# Patient Record
Sex: Male | Born: 1955 | Race: Black or African American | Hispanic: No | Marital: Single | Smoking: Never smoker
Health system: Southern US, Community
[De-identification: ages and names within clinical notes are randomized; demographics above are authoritative.]

## PROBLEM LIST (undated history)

## (undated) DIAGNOSIS — N200 Calculus of kidney: Secondary | ICD-10-CM

## (undated) DIAGNOSIS — R351 Nocturia: Secondary | ICD-10-CM

## (undated) HISTORY — PX: APPENDECTOMY: SHX54

---

## 2001-09-10 ENCOUNTER — Emergency Department (HOSPITAL_COMMUNITY): Admission: EM | Admit: 2001-09-10 | Discharge: 2001-09-10 | Payer: Self-pay

## 2001-09-10 ENCOUNTER — Encounter: Payer: Self-pay | Admitting: Emergency Medicine

## 2009-05-19 ENCOUNTER — Emergency Department (HOSPITAL_COMMUNITY): Admission: EM | Admit: 2009-05-19 | Discharge: 2009-05-19 | Payer: Self-pay | Admitting: Emergency Medicine

## 2009-05-23 ENCOUNTER — Encounter (INDEPENDENT_AMBULATORY_CARE_PROVIDER_SITE_OTHER): Payer: Self-pay | Admitting: Emergency Medicine

## 2009-05-23 ENCOUNTER — Ambulatory Visit: Payer: Self-pay | Admitting: *Deleted

## 2009-05-23 ENCOUNTER — Emergency Department (HOSPITAL_COMMUNITY): Admission: EM | Admit: 2009-05-23 | Discharge: 2009-05-23 | Payer: Self-pay | Admitting: Emergency Medicine

## 2009-05-30 ENCOUNTER — Emergency Department (HOSPITAL_COMMUNITY): Admission: EM | Admit: 2009-05-30 | Discharge: 2009-05-30 | Payer: Self-pay | Admitting: Emergency Medicine

## 2011-01-20 LAB — URINALYSIS, ROUTINE W REFLEX MICROSCOPIC
Bilirubin Urine: NEGATIVE
Glucose, UA: NEGATIVE mg/dL
Ketones, ur: NEGATIVE mg/dL
Leukocytes, UA: NEGATIVE
Nitrite: NEGATIVE
Protein, ur: 30 mg/dL — AB
Specific Gravity, Urine: 1.017 (ref 1.005–1.030)
Urobilinogen, UA: 0.2 mg/dL (ref 0.0–1.0)
pH: 6 (ref 5.0–8.0)

## 2011-01-20 LAB — URINE MICROSCOPIC-ADD ON

## 2011-01-21 LAB — CBC
HCT: 37.4 % — ABNORMAL LOW (ref 39.0–52.0)
Hemoglobin: 12.6 g/dL — ABNORMAL LOW (ref 13.0–17.0)
MCHC: 33.6 g/dL (ref 30.0–36.0)
MCV: 89.7 fL (ref 78.0–100.0)
Platelets: 205 10*3/uL (ref 150–400)
RBC: 4.18 MIL/uL — ABNORMAL LOW (ref 4.22–5.81)
RDW: 14.1 % (ref 11.5–15.5)
WBC: 12.6 10*3/uL — ABNORMAL HIGH (ref 4.0–10.5)

## 2011-01-21 LAB — DIFFERENTIAL
Basophils Absolute: 0 10*3/uL (ref 0.0–0.1)
Basophils Relative: 0 % (ref 0–1)
Lymphocytes Relative: 7 % — ABNORMAL LOW (ref 12–46)
Monocytes Absolute: 0.5 10*3/uL (ref 0.1–1.0)
Monocytes Relative: 4 % (ref 3–12)
Neutro Abs: 11.2 10*3/uL — ABNORMAL HIGH (ref 1.7–7.7)
Neutrophils Relative %: 89 % — ABNORMAL HIGH (ref 43–77)

## 2011-01-21 LAB — POCT I-STAT, CHEM 8
BUN: 24 mg/dL — ABNORMAL HIGH (ref 6–23)
Chloride: 99 mEq/L (ref 96–112)
Glucose, Bld: 108 mg/dL — ABNORMAL HIGH (ref 70–99)
HCT: 40 % (ref 39.0–52.0)
Potassium: 3.2 mEq/L — ABNORMAL LOW (ref 3.5–5.1)

## 2011-01-21 LAB — D-DIMER, QUANTITATIVE: D-Dimer, Quant: 2.82 ug/mL-FEU — ABNORMAL HIGH (ref 0.00–0.48)

## 2015-08-25 ENCOUNTER — Emergency Department (INDEPENDENT_AMBULATORY_CARE_PROVIDER_SITE_OTHER)
Admission: EM | Admit: 2015-08-25 | Discharge: 2015-08-25 | Disposition: A | Discharge status: Nursing Home (Includes ICF) | Payer: 59 | Source: Home / Self Care | Attending: Family Medicine | Admitting: Family Medicine

## 2015-08-25 ENCOUNTER — Encounter (HOSPITAL_COMMUNITY): Payer: Self-pay | Admitting: *Deleted

## 2015-08-25 ENCOUNTER — Emergency Department (HOSPITAL_COMMUNITY): Payer: 59

## 2015-08-25 ENCOUNTER — Encounter (HOSPITAL_COMMUNITY): Payer: Self-pay | Admitting: Emergency Medicine

## 2015-08-25 ENCOUNTER — Emergency Department (HOSPITAL_COMMUNITY)
Admission: EM | Admit: 2015-08-25 | Discharge: 2015-08-25 | Disposition: A | Discharge status: Home / Self Care | Payer: 59 | Attending: Emergency Medicine | Admitting: Emergency Medicine

## 2015-08-25 DIAGNOSIS — Z87442 Personal history of urinary calculi: Secondary | ICD-10-CM | POA: Insufficient documentation

## 2015-08-25 DIAGNOSIS — N200 Calculus of kidney: Secondary | ICD-10-CM

## 2015-08-25 DIAGNOSIS — R1032 Left lower quadrant pain: Secondary | ICD-10-CM

## 2015-08-25 DIAGNOSIS — N12 Tubulo-interstitial nephritis, not specified as acute or chronic: Secondary | ICD-10-CM | POA: Insufficient documentation

## 2015-08-25 LAB — URINALYSIS, ROUTINE W REFLEX MICROSCOPIC
Bilirubin Urine: NEGATIVE
GLUCOSE, UA: NEGATIVE mg/dL
KETONES UR: NEGATIVE mg/dL
Leukocytes, UA: NEGATIVE
Nitrite: NEGATIVE
PROTEIN: 30 mg/dL — AB
Specific Gravity, Urine: 1.017 (ref 1.005–1.030)
Urobilinogen, UA: 0.2 mg/dL (ref 0.0–1.0)
pH: 5.5 (ref 5.0–8.0)

## 2015-08-25 LAB — CBC
HCT: 41 % (ref 39.0–52.0)
HEMOGLOBIN: 13.6 g/dL (ref 13.0–17.0)
MCH: 29.2 pg (ref 26.0–34.0)
MCHC: 33.2 g/dL (ref 30.0–36.0)
MCV: 88 fL (ref 78.0–100.0)
PLATELETS: 334 10*3/uL (ref 150–400)
RBC: 4.66 MIL/uL (ref 4.22–5.81)
RDW: 13.8 % (ref 11.5–15.5)
WBC: 16 10*3/uL — AB (ref 4.0–10.5)

## 2015-08-25 LAB — COMPREHENSIVE METABOLIC PANEL
ALT: 21 U/L (ref 17–63)
ANION GAP: 7 (ref 5–15)
AST: 26 U/L (ref 15–41)
Albumin: 3.9 g/dL (ref 3.5–5.0)
Alkaline Phosphatase: 72 U/L (ref 38–126)
BUN: 20 mg/dL (ref 6–20)
CALCIUM: 9.6 mg/dL (ref 8.9–10.3)
CHLORIDE: 101 mmol/L (ref 101–111)
CO2: 28 mmol/L (ref 22–32)
CREATININE: 1.54 mg/dL — AB (ref 0.61–1.24)
GFR, EST AFRICAN AMERICAN: 54 mL/min — AB (ref >60)
GFR, EST NON AFRICAN AMERICAN: 47 mL/min — AB (ref >60)
Glucose, Bld: 125 mg/dL — ABNORMAL HIGH (ref 65–99)
Potassium: 4.7 mmol/L (ref 3.5–5.1)
SODIUM: 136 mmol/L (ref 135–145)
Total Bilirubin: 0.4 mg/dL (ref 0.3–1.2)
Total Protein: 8.4 g/dL — ABNORMAL HIGH (ref 6.5–8.1)

## 2015-08-25 LAB — URINE MICROSCOPIC-ADD ON

## 2015-08-25 LAB — POCT URINALYSIS DIP (DEVICE)
Bilirubin Urine: NEGATIVE
GLUCOSE, UA: NEGATIVE mg/dL
KETONES UR: NEGATIVE mg/dL
Leukocytes, UA: NEGATIVE
NITRITE: NEGATIVE
PH: 6.5 (ref 5.0–8.0)
PROTEIN: 30 mg/dL — AB
Specific Gravity, Urine: 1.02 (ref 1.005–1.030)
UROBILINOGEN UA: 0.2 mg/dL (ref 0.0–1.0)

## 2015-08-25 LAB — LIPASE, BLOOD: LIPASE: 23 U/L (ref 11–51)

## 2015-08-25 MED ORDER — SODIUM CHLORIDE 0.9 % IV BOLUS (SEPSIS)
1000.0000 mL | Freq: Once | INTRAVENOUS | Status: AC
  Administered 2015-08-25: 1000 mL via INTRAVENOUS

## 2015-08-25 MED ORDER — HYDROCODONE-ACETAMINOPHEN 5-325 MG PO TABS: 1.0000 | ORAL_TABLET | Freq: Four times a day (QID) | ORAL | Status: DC | PRN

## 2015-08-25 MED ORDER — HYDROCODONE-ACETAMINOPHEN 5-325 MG PO TABS
2.0000 | ORAL_TABLET | Freq: Once | ORAL | Status: AC
  Administered 2015-08-25: 2 via ORAL
  Filled 2015-08-25: qty 2

## 2015-08-25 NOTE — ED Notes (Signed)
Pt being transferred to ED via shuttle for further evaluation for LLQ pain and nausea.  Report was called to First RN in the ED.

## 2015-08-25 NOTE — ED Provider Notes (Addendum)
CSN: [id number]     Arrival date & time 08/25/15  1304 History   First MD Initiated Contact with Patient 08/25/15 1359     Chief Complaint  Patient presents with  . Abdominal Pain  . Nausea   (Consider location/radiation/quality/duration/timing/severity/associated sxs/prior Treatment) Patient is a 59 y.o. male presenting with abdominal pain. The history is provided by the patient. No language interpreter was used.  Abdominal Pain Associated symptoms: chills, fatigue and nausea   Associated symptoms: no chest pain, no cough, no diarrhea, no dysuria, no fever and no vomiting   Patient presents with complaint of LLQ abdominal pain, decreased appetite and feeling hot/chills during the day since he woke up this morning. Abd pain is described as "achy", has been present since waking up and has not remitted. No medications other than pepto bismol for some nausea; no emesis. No diarrhea. Last BM last night, formed and no blood. No dysuria, no change in urinary habits or apppearance of urine.   Surgical Hx; Appy 20+ years ago.   History reviewed. No pertinent past medical history. Past Surgical History  Procedure Laterality Date  . Appendectomy     History reviewed. No pertinent family history. Social History  Substance Use Topics  . Smoking status: Never Smoker   . Smokeless tobacco: None  . Alcohol Use: Yes     Comment: occasional    Review of Systems  Constitutional: Positive for chills, diaphoresis, activity change, appetite change and fatigue. Negative for fever.  Respiratory: Negative for cough, chest tightness and wheezing.   Cardiovascular: Negative for chest pain and palpitations.  Gastrointestinal: Positive for nausea and abdominal pain. Negative for vomiting, diarrhea, blood in stool and anal bleeding.  Genitourinary: Negative for dysuria.    Allergies  Review of patient's allergies indicates no known allergies.  Home Medications   Prior to Admission medications   Not  on File   Meds Ordered and Administered this Visit  Medications - No data to display  BP 158/68 mmHg  Pulse 54  Temp(Src) 98.3 F (36.8 C) (Oral)  Resp 16  SpO2 100% No data found.   Physical Exam  Constitutional: He appears well-developed and well-nourished. No distress.  Generally well appearing, mildly ill appearing but in no apparent distress  HENT:  Head: Normocephalic and atraumatic.  Mouth/Throat: Oropharynx is clear and moist. No oropharyngeal exudate.  Neck: Normal range of motion. Neck supple.  Cardiovascular: Normal rate, regular rhythm and normal heart sounds.   Pulmonary/Chest: Effort normal and breath sounds normal. No respiratory distress. He has no wheezes. He has no rales. He exhibits no tenderness.  Abdominal: Soft. Bowel sounds are normal. He exhibits no distension and no mass. There is no tenderness. There is no rebound and no guarding.  Negative for CVA tenderness.  Skin without rashes or vesicular lesions; he describes the area of pain as radiating from flank (mid-axillary line) at around T10 level, radiating anteriorly and inferiorly to LLQ.   Lymphadenopathy:    He has no cervical adenopathy.  Skin: He is not diaphoretic.    ED Course  Procedures (including critical care time)  Labs Review Labs Reviewed - No data to display  Imaging Review No results found.   Visual Acuity Review  Right Eye Distance:   Left Eye Distance:   Bilateral Distance:    Right Eye Near:   Left Eye Near:    Bilateral Near:         MDM   1. Abdominal pain, acute, left lower  quadrant    Patient with LLQ abdominal pain, without associated symptoms attributable to GI, GU or MSK systems. No tenderness on exam; no findings on skin to support possible diagnosis of zoster. UA positive for Hgb, concern for nephrolithiasis but cannot exclude other GU or GI causes. Transfer to ED for possible CT abd/pelvis, CBC for WBC count.      Barbaraann Barthel, MD 08/25/15  1422  Barbaraann Barthel, MD 08/25/15 [phone]

## 2015-08-25 NOTE — ED Notes (Signed)
Pt is aware urine is needed, pt is unable to urinate at this time. Urinal at bedside.  

## 2015-08-25 NOTE — ED Notes (Signed)
Pt states he woke up with a slight pain in his LLQ this morning, but the pain has progressed all day.  He reports some mild radiation to his LL Back.  He reports no BM today, but also hasn't eaten all day.  He took some Pepto-Bismol that relieved the nausea, but the pain has not gone away.  He denies any problems with urination or fever, but does report chills and sweating.

## 2015-08-25 NOTE — ED Provider Notes (Signed)
Arrival Date & Time: 08/25/15 & 1458 History   Chief Complaint  Patient presents with  . Abdominal Pain   HPI James Horn is a 59 y.o. male who presents for assessment of left lower quadrant pain that radiates from left flank. Patient states that he is symptomatic with a dull cramp and ache that is constant since this morning. Patient had nausea and emesis 1 however has no abnormal bowel function and no fevers or chills no shortness of breath or chest pain. Patient states that he has no back pain. And no urinary symptoms. Patient has not noticed blood in urine. Patient has remote history of prior kidney stones however does not remember which side there are on and states that no intervention was required and they sent him home with a strainer and that this was approximately 25 years prior.  I reviewed & agree with nursing's documentation of PMHx, PSHx, SHx & FHx. History reviewed. No pertinent past medical history. Past Surgical History  Procedure Laterality Date  . Appendectomy     Social History   Social History  . Marital Status: Single    Spouse Name: N/A  . Number of Children: N/A  . Years of Education: N/A   Social History Main Topics  . Smoking status: Never Smoker   . Smokeless tobacco: None  . Alcohol Use: Yes     Comment: occasional  . Drug Use: No  . Sexual Activity: Not Asked   Other Topics Concern  . None   Social History Narrative   No family history on file.  Review of Systems   Complete Review of Systems obtained and is negative except as stated in HPI.  Allergies  Review of patient's allergies indicates no known allergies.  Home Medications   Prior to Admission medications   Not on File    Physical Exam  BP 171/77 mmHg  Pulse 52  Temp(Src) 97.7 F (36.5 C) (Oral)  Resp 16  SpO2 97% Physical Exam  Constitutional: He is oriented to person, place, and time. He appears well-developed and well-nourished.  Non-toxic appearance. He does not  appear ill. No distress.  HENT:  Head: Normocephalic and atraumatic.  Right Ear: External ear normal.  Left Ear: External ear normal.  Eyes: Pupils are equal, round, and reactive to light. No scleral icterus.  Neck: Normal range of motion. Neck supple. No tracheal deviation present.  Cardiovascular: Normal heart sounds and intact distal pulses.   No murmur heard. Pulmonary/Chest: Effort normal and breath sounds normal. No stridor. No respiratory distress. He has no wheezes. He has no rales.  Abdominal: Soft. Bowel sounds are normal. He exhibits no distension. There is tenderness (LLQ). There is no rebound and no guarding.  No CVA tenderness bilaterally.  Musculoskeletal: Normal range of motion.  Neurological: He is alert and oriented to person, place, and time. He has normal strength and normal reflexes. No cranial nerve deficit or sensory deficit.  Skin: Skin is warm and dry. No pallor.  No vesicles or rashes.   Psychiatric: He has a normal mood and affect. His behavior is normal.  Nursing note and vitals reviewed.   ED Course  Procedures  Labs Review Labs Reviewed  COMPREHENSIVE METABOLIC PANEL - Abnormal; Notable for the following:    Glucose, Bld 125 (*)    Creatinine, Ser 1.54 (*)    Total Protein 8.4 (*)    GFR calc non Af Amer 47 (*)    GFR calc Af Amer 54 (*)  All other components within normal limits  CBC - Abnormal; Notable for the following:    WBC 16.0 (*)    All other components within normal limits  LIPASE, BLOOD  URINALYSIS, ROUTINE W REFLEX MICROSCOPIC (NOT AT [facility])    Imaging Review No results found.  Laboratory and Imaging results were personally reviewed by myself and used in the medical decision making of this patient's treatment and disposition.  EKG Interpretation  EKG Interpretation  Date/Time:    Ventricular Rate:    PR Interval:    QRS Duration:   QT Interval:    QTC Calculation:   R Axis:     Text Interpretation:        MDM  James Horn is a 59 y.o. male with H&P as above. ED clinical course as follows:  Prior workup in urgent care shows UA with gross blood however no concerns of infection at that time. Urine studies repeated along with microscopic and repeat shows no evidence of infection however moderate amounts of blood.  Review of evaluate for nephrolithiasis ultrasound was obtained which reveals nonobstructing stones in left kidney without associated hydronephrosis bilaterally. No abnormal findings of right kidney. Patient is well-appearing without altered mental status or vital sign abnormalities afebrile and without hypotension therefore do not suspect sepsis however in light of elevated white blood cell count we'll obtain CT noncontrast to evaluate for evidence of pyelonephritis. Patient has no CVA tenderness is without emesis and does not appear toxic. CT noncontrast upon my independent visualization reveals a 4 x 7 mm obstructing stone with only mild hydronephrosis of left kidney and mild perinephric stranding consistent with inflammation and no evidence of abscess. In light of patient's negative CVA tenderness and clinical picture without concerns for pyelonephritis or sepsis patient is appropriate for discharge with follow-up with urology at 8 AM tomorrow morning. I obtained consultation with the urology service and after we discussed patient's ED clinical course and imaging and laboratory work decided patient is appropriate for discharge and will be seen at 8 AM in the morning by the clinic. Return precautions discussed in great detail patient states understanding no evidence of toxicity on repeat examination and patient states pain controlled without any concerns or questions prior to discharge patient tolerating by mouth and no pain in abdomen flank or back at this time.  Patient will be discharged with by mouth pain control and given negative UA for signs of infection and absence of CVA tenderness do not suspect UTI or  pyelonephritis and therefore was not discharged with her buttocks at this time.  Disposition: Discharge.  Clinical Impression: No diagnosis found. Patient care discussed with Dr. Jeraldine Loots, who oversaw their evaluation & treatment & voiced agreement. House Officer: Jonette Eva, MD, Emergency Medicine.  Jonette Eva, MD 08/26/15 Moses Manners  Gerhard Munch, MD 08/27/15 [phone]

## 2015-08-25 NOTE — Discharge Instructions (Signed)
Kidney Stones °Kidney stones (urolithiasis) are deposits that form inside your kidneys. The intense pain is caused by the stone moving through the urinary tract. When the stone moves, the ureter goes into spasm around the stone. The stone is usually passed in the urine.  °CAUSES  °· A disorder that makes certain neck glands produce too much parathyroid hormone (primary hyperparathyroidism). °· A buildup of uric acid crystals, similar to gout in your joints. °· Narrowing (stricture) of the ureter. °· A kidney obstruction present at birth (congenital obstruction). °· Previous surgery on the kidney or ureters. °· Numerous kidney infections. °SYMPTOMS  °· Feeling sick to your stomach (nauseous). °· Throwing up (vomiting). °· Blood in the urine (hematuria). °· Pain that usually spreads (radiates) to the groin. °· Frequency or urgency of urination. °DIAGNOSIS  °· Taking a history and physical exam. °· Blood or urine tests. °· CT scan. °· Occasionally, an examination of the inside of the urinary bladder (cystoscopy) is performed. °TREATMENT  °· Observation. °· Increasing your fluid intake. °· Extracorporeal shock wave lithotripsy--This is a noninvasive procedure that uses shock waves to break up kidney stones. °· Surgery may be needed if you have severe pain or persistent obstruction. There are various surgical procedures. Most of the procedures are performed with the use of small instruments. Only small incisions are needed to accommodate these instruments, so recovery time is minimized. °The size, location, and chemical composition are all important variables that will determine the proper choice of action for you. Talk to your health care provider to better understand your situation so that you will minimize the risk of injury to yourself and your kidney.  °HOME CARE INSTRUCTIONS  °· Drink enough water and fluids to keep your urine clear or pale yellow. This will help you to pass the stone or stone fragments. °· Strain  all urine through the provided strainer. Keep all particulate matter and stones for your health care provider to see. The stone causing the pain may be as small as a grain of salt. It is very important to use the strainer each and every time you pass your urine. The collection of your stone will allow your health care provider to analyze it and verify that a stone has actually passed. The stone analysis will often identify what you can do to reduce the incidence of recurrences. °· Only take over-the-counter or prescription medicines for pain, discomfort, or fever as directed by your health care provider. °· Keep all follow-up visits as told by your health care provider. This is important. °· Get follow-up X-rays if required. The absence of pain does not always mean that the stone has passed. It may have only stopped moving. If the urine remains completely obstructed, it can cause loss of kidney function or even complete destruction of the kidney. It is your responsibility to make sure X-rays and follow-ups are completed. Ultrasounds of the kidney can show blockages and the status of the kidney. Ultrasounds are not associated with any radiation and can be performed easily in a matter of minutes. °· Make changes to your daily diet as told by your health care provider. You may be told to: °¨ Limit the amount of salt that you eat. °¨ Eat 5 or more servings of fruits and vegetables each day. °¨ Limit the amount of meat, poultry, fish, and eggs that you eat. °· Collect a 24-hour urine sample as told by your health care provider. You may need to collect another urine sample every 6-12   months. SEEK MEDICAL CARE IF:  You experience pain that is progressive and unresponsive to any pain medicine you have been prescribed. SEEK IMMEDIATE MEDICAL CARE IF:   Pain cannot be controlled with the prescribed medicine.  You have a fever or shaking chills.  The severity or intensity of pain increases over 18 hours and is not  relieved by pain medicine.  You develop a new onset of abdominal pain.  You feel faint or pass out.  You are unable to urinate.   This information is not intended to replace advice given to you by your health care provider. Make sure you discuss any questions you have with your health care provider.   Document Released: 10/01/2005 Document Revised: 06/22/2015 Document Reviewed: 03/04/2013 Elsevier Interactive Patient Education 2016 [employer] Inc[employer]  Renal Colic Renal colic is pain that is caused by passing a kidney stone. The pain can be sharp and severe. It may be felt in the back, abdomen, side (flank), or groin. It can cause nausea. Renal colic can come and go. HOME CARE INSTRUCTIONS Watch your condition for any changes. The following actions may help to lessen any discomfort that you are feeling:  Take medicines only as directed by your health care provider.  Ask your health care provider if it is okay to take over-the-counter pain medicine.  Drink enough fluid to keep your urine clear or pale yellow. Drink 6-8 glasses of water each day.  Limit the amount of salt that you eat to less than 2 grams per day.  Reduce the amount of protein in your diet. Eat less meat, fish, nuts, and dairy.  Avoid foods such as spinach, rhubarb, nuts, or bran. These may make kidney stones more likely to form. SEEK MEDICAL CARE IF:  You have a fever or chills.  Your urine smells bad or looks cloudy.  You have pain or burning when you pass urine. SEEK IMMEDIATE MEDICAL CARE IF:  Your flank pain or groin pain suddenly worsens.  You become confused or disoriented or you lose consciousness.   This information is not intended to replace advice given to you by your health care provider. Make sure you discuss any questions you have with your health care provider.   Document Released: 07/11/2005 Document Revised: 10/22/2014 Document Reviewed: 08/11/2014 Elsevier Interactive Patient Education 2016  [employer].  Lithotripsy, Care After Refer to this sheet in the next few weeks. These instructions provide you with information on caring for yourself after your procedure. Your health care provider may also give you more specific instructions. Your treatment has been planned according to current medical practices, but problems sometimes occur. Call your health care provider if you have any problems or questions after your procedure. WHAT TO EXPECT AFTER THE PROCEDURE   Your urine may have a red tinge for a few days after treatment. Blood loss is usually minimal.  You may have soreness in the back or flank area. This usually goes away after a few days. The procedure can cause blotches or bruises on the back where the pressure wave enters the skin. These marks usually cause only minimal discomfort and should disappear in a short time.  Stone fragments should begin to pass within 24 hours of treatment. However, a delayed passage is not unusual.  You may have pain, discomfort, and feel sick to your stomach (nauseated) when the crushed fragments of stone are passed down the tube from the kidney to the bladder. Stone fragments can pass soon after the procedure and  may last for up to 4-8 weeks.  A small number of patients may have severe pain when stone fragments are not able to pass, which leads to an obstruction.  If your stone is greater than 1 inch (2.5 cm) in diameter or if you have multiple stones that have a combined diameter greater than 1 inch (2.5 cm), you may require more than one treatment.  If you had a stent placed prior to your procedure, you may experience some discomfort, especially during urination. You may experience the pain or discomfort in your flank or back, or you may experience a sharp pain or discomfort at the base of your penis or in your lower abdomen. The discomfort usually lasts only a few minutes after urinating. HOME CARE INSTRUCTIONS   Rest at home until you feel  your energy improving.  Only take over-the-counter or prescription medicines for pain, discomfort, or fever as directed by your health care provider. Depending on the type of lithotripsy, you may need to take antibiotics and anti-inflammatory medicines for a few days.  Drink enough water and fluids to keep your urine clear or pale yellow. This helps "flush" your kidneys. It helps pass any remaining pieces of stone and prevents stones from coming back.  Most people can resume daily activities within 1-2 days after standard lithotripsy. It can take longer to recover from laser and percutaneous lithotripsy.  Strain all urine through the provided strainer. Keep all particulate matter and stones for your health care provider to see. The stone may be as small as a grain of salt. It is very important to use the strainer each and every time you pass your urine. Any stones that are found can be sent to a medical lab for examination.  Visit your health care provider for a follow-up appointment in a few weeks. Your doctor may remove your stent if you have one. Your health care provider will also check to see whether stone particles still remain. SEEK MEDICAL CARE IF:   Your pain is not relieved by medicine.  You have a lasting nauseous feeling.  You feel there is too much blood in the urine.  You develop persistent problems with frequent or painful urination that does not at least partially improve after 2 days following the procedure.  You have a congested cough.  You feel lightheaded.  You develop a rash or any other signs that might suggest an allergic problem.  You develop any reaction or side effects to your medicine(s). SEEK IMMEDIATE MEDICAL CARE IF:   You experience severe back or flank pain or both.  You see nothing but blood when you urinate.  You cannot pass any urine at all.  You have a fever or shaking chills.  You develop shortness of breath, difficulty breathing, or chest  pain.  You develop vomiting that will not stop after 6-8 hours.  You have a fainting episode.   This information is not intended to replace advice given to you by your health care provider. Make sure you discuss any questions you have with your health care provider.   Document Released: 10/21/2007 Document Revised: 06/22/2015 Document Reviewed: 04/16/2013 Elsevier Interactive Patient Education 2016 Elsevier Inc.  Percutaneous Nephrolithotomy Kidney stones can cause a great deal of pain. They can block urine from leaving the body. And they can lead to infection. Kidney stones might pass on their own, or they can be broken up by shock waves from a special machine. But sometimes surgery is needed to get  rid of kidney stones. One type of surgery is called percutaneous nephrolithotomy. "Nephro" means kidney, "litho" means stone and "tomy" means removal by surgery. "Percutaneous" means through the skin. This type of surgery needs only a small cut (incision) in the skin. This surgery may be suggested for various reasons. They include:  The kidney stones are 2 centimeters wide (about 3/4 inch) or bigger. They also might be oddly shaped.  Other treatments were tried, but all or some of the kidney stones remain.  Infection has developed.  No other treatment can be used. LET YOUR CAREGIVER KNOW ABOUT:   Any allergies.  All medications you are taking, including:  Herbs, eyedrops, over-the-counter medications and creams.  Blood thinners (anticoagulants), aspirin or other drugs that could affect blood clotting.  Use of steroids (by mouth or as creams).  Previous problems with anesthetics, including local anesthetics.  Possibility of pregnancy, if this applies.  Any history of blood clots.  Any history of bleeding or other blood problems.  Previous surgery.  Smoking history.  Other health problems. RISKS AND COMPLICATIONS   During surgery:  Sometimes all the kidney stones cannot  be removed through the tube. Then, the percutaneous nephrolithotomy procedure would be stopped. Open surgery would be used to remove the remaining stones.  Short-term risks from the surgery could include:  Excessive bleeding.  Blood in your urine.  Holes in the kidney. These usually heal on their own.  Pain.  Redness or tenderness at the incision site.  Numbness (loss of feeling) in the area treated. Tingling also is possible.  A pooling of blood in the wound (hematoma).  Infection.  Slow healing.  Longer-term possibilities include:  Kidney damage.  Damage to organs near the kidney.  Need for a repeat surgery. BEFORE THE PROCEDURE  You may need to take some tests before your surgery. These might include:  Blood tests.  Urine tests.  Tests to make sure your heart is working properly.  Let your healthcare provider know if you think you might have a urinary tract infection. You will probably need to take an antibiotic to treat this before the surgery.  Two weeks before your surgery, stop using aspirin and non-steroidal anti-inflammatory drugs (NSAIDs) for pain relief. This includes prescription drugs and over-the-counter drugs such as ibuprofen and naproxen. Also stop taking vitamin E.  If you take blood-thinners, ask your healthcare provider when you should stop taking them.  Do not eat or drink for about 8 hours before your surgery.  You might be asked to shower or wash with a special antibacterial soap before the procedure.  Arrive at least an hour before the surgery, or whenever your surgeon recommends. This will give you time to check in and fill out any needed paperwork. PROCEDURE  The preparation:  You will change into a hospital gown.  You will be given an IV. A needle will be inserted in your arm. Medication will be able to flow directly into your body through this needle.  You might be given a sedative. This medication will help you relax.  You may  be given a general anesthetic (a drug that will put you to sleep during the surgery). Or, you may get a local anesthetic (part of your body will be numb, but you will remain awake).  A catheter (tube) will be put in your bladder to drain urine during and after surgery.  The procedure:  The surgeon will make a small incision in your lower back.  A tube  will be inserted through the incision into your kidney.  Each kidney stone is removed through this tube. Larger stones may first be broken up with a laser (a high-intensity light beam) or other tools.  If a kidney stone has already left the kidney, the surgeon would use a special tool to bring it back in. Then it would be removed through the tube.  After all the stones are taken out, a catheter will be put in. Fluid can build up around the kidney as it heals. The catheter lets this fluid drain out of the body.  A dressing (medicine and bandage) will be put on the incision area.  The surgery usually takes three to four hours. AFTER THE PROCEDURE  You will stay in a recovery area until the anesthesia has worn off. Your blood pressure and pulse will be checked often. You might be given more pain medication.  You will be moved to a hospital room for the rest of your stay.  The catheter that is taking urine out of your body will be taken out within 24 hours.  The day after your surgery, you should be able to walk around. Walking helps prevent blood clots (thick clumps that can block the flow of blood).  You may be asked to do some breathing exercises.  You will be able to have only liquids for a day or two.  Before you go home:  The catheter that is draining fluid from the kidney area is usually taken out.  You will be taught how to care for the incision. Be sure to ask how often the dressing should be changed and when it can get wet.  You also will be told what you should and should not do while your kidney and incisions heal. For  instance, you may be urged to walk to prevent blood clots.   This information is not intended to replace advice given to you by your health care provider. Make sure you discuss any questions you have with your health care provider.   Document Released: 07/29/2009 Document Revised: 10/22/2014 Document Reviewed: 03/28/2015 Elsevier Interactive Patient Education [employer]

## 2015-08-25 NOTE — ED Notes (Signed)
Pt states that he began having LLQ pain this morning. Pt states that he had N/V as well. Denies any bowel or bladder changes

## 2015-08-26 ENCOUNTER — Telehealth (HOSPITAL_BASED_OUTPATIENT_CLINIC_OR_DEPARTMENT_OTHER): Payer: Self-pay | Admitting: Emergency Medicine

## 2015-08-26 LAB — URINE CULTURE
CULTURE: NO GROWTH
Special Requests: NORMAL

## 2015-09-14 ENCOUNTER — Other Ambulatory Visit: Payer: Self-pay | Admitting: Urology

## 2015-09-28 ENCOUNTER — Ambulatory Visit (HOSPITAL_COMMUNITY)
Admission: RE | Admit: 2015-09-28 | Discharge: 2015-09-28 | Disposition: A | Discharge status: Home / Self Care | Payer: 59 | Source: Ambulatory Visit | Attending: Anesthesiology | Admitting: Anesthesiology

## 2015-09-28 ENCOUNTER — Encounter (HOSPITAL_COMMUNITY)
Admission: RE | Admit: 2015-09-28 | Discharge: 2015-09-28 | Disposition: A | Discharge status: Home / Self Care | Payer: 59 | Source: Ambulatory Visit | Attending: Urology | Admitting: Urology

## 2015-09-28 ENCOUNTER — Encounter (HOSPITAL_COMMUNITY): Payer: Self-pay

## 2015-09-28 DIAGNOSIS — Z01812 Encounter for preprocedural laboratory examination: Secondary | ICD-10-CM | POA: Diagnosis not present

## 2015-09-28 DIAGNOSIS — Z01810 Encounter for preprocedural cardiovascular examination: Secondary | ICD-10-CM | POA: Diagnosis not present

## 2015-09-28 HISTORY — DX: Nocturia: R35.1

## 2015-09-28 HISTORY — DX: Calculus of kidney: N20.0

## 2015-09-28 LAB — BASIC METABOLIC PANEL
Anion gap: 8 (ref 5–15)
BUN: 18 mg/dL (ref 6–20)
CALCIUM: 9.4 mg/dL (ref 8.9–10.3)
CO2: 27 mmol/L (ref 22–32)
CREATININE: 1.08 mg/dL (ref 0.61–1.24)
Chloride: 103 mmol/L (ref 101–111)
Glucose, Bld: 104 mg/dL — ABNORMAL HIGH (ref 65–99)
Potassium: 3.8 mmol/L (ref 3.5–5.1)
Sodium: 138 mmol/L (ref 135–145)

## 2015-09-28 LAB — CBC
HCT: 36.9 % — ABNORMAL LOW (ref 39.0–52.0)
Hemoglobin: 12 g/dL — ABNORMAL LOW (ref 13.0–17.0)
MCH: 28.5 pg (ref 26.0–34.0)
MCHC: 32.5 g/dL (ref 30.0–36.0)
MCV: 87.6 fL (ref 78.0–100.0)
PLATELETS: 338 10*3/uL (ref 150–400)
RBC: 4.21 MIL/uL — ABNORMAL LOW (ref 4.22–5.81)
RDW: 13.8 % (ref 11.5–15.5)
WBC: 8.6 10*3/uL (ref 4.0–10.5)

## 2015-09-28 NOTE — Patient Instructions (Signed)
Turner DanielsHandy L Radich  09/28/2015   Your procedure is scheduled on: Monday October 03, 2015   [facility]  [facility] [facility]  [place] to 3rd floor to  Short Stay Center at 7:45 AM.  Call this number if you have problems the morning of surgery [phone]   Remember: ONLY 1 PERSON MAY GO WITH YOU TO SHORT STAY TO GET  READY MORNING OF YOUR SURGERY.  Do not eat food or drink liquids :After Midnight.     Take these medicines the morning of surgery with A SIP OF WATER: NONE                                You may not have any metal on your body including hair pins and              piercings  Do not wear jewelry lotions, powders or colognes, deodorant                           Men may shave face and neck.   Do not bring valuables to the hospital. [facility] IS NOT             RESPONSIBLE   FOR VALUABLES.  Contacts, dentures or bridgework may not be worn into surgery.      Patients discharged the day of surgery will not be allowed to drive home.  Name and phone number of your driver:Gwen Ophelia CharterYates (wife)  _____________________________________________________________________             [facility] Before surgery, you can play an important role.  Because skin is not sterile, your skin needs to be as free of germs as possible.  You can reduce the number of germs on your skin by washing with CHG (chlorahexidine gluconate) soap before surgery.  CHG is an antiseptic cleaner which kills germs and bonds with the skin to continue killing germs even after washing. Please DO NOT use if you have an allergy to CHG or antibacterial soaps.  If your skin becomes reddened/irritated stop using the CHG and inform your nurse when you arrive at Short Stay. Do not shave (including legs and underarms) for at least 48 hours prior to the first CHG shower.  You may shave your face/neck. Please follow these instructions carefully:  1.  Shower with CHG Soap  the night before surgery and the  morning of Surgery.  2.  If you choose to wash your hair, wash your hair first as usual with your  normal  shampoo.  3.  After you shampoo, rinse your hair and body thoroughly to remove the  shampoo.                           4.  Use CHG as you would any other liquid soap.  You can apply chg directly  to the skin and wash                       Gently with a scrungie or clean washcloth.  5.  Apply the CHG Soap to your body ONLY FROM THE NECK DOWN.   Do not use on face/ open  Wound or open sores. Avoid contact with eyes, ears mouth and genitals (private parts).                       Wash face,  Genitals (private parts) with your normal soap.             6.  Wash thoroughly, paying special attention to the area where your surgery  will be performed.  7.  Thoroughly rinse your body with warm water from the neck down.  8.  DO NOT shower/wash with your normal soap after using and rinsing off  the CHG Soap.                9.  Pat yourself dry with a clean towel.            10.  Wear clean pajamas.            11.  Place clean sheets on your bed the night of your first shower and do not  sleep with pets. Day of Surgery : Do not apply any lotions/deodorants the morning of surgery.  Please wear clean clothes to the hospital/surgery center.  FAILURE TO FOLLOW THESE INSTRUCTIONS MAY RESULT IN THE CANCELLATION OF YOUR SURGERY PATIENT SIGNATURE_________________________________  NURSE SIGNATURE__________________________________  ________________________________________________________________________

## 2015-09-29 NOTE — H&P (Signed)
History of Present Illness James Horn is 59 years old with the following urologic history:    1) Urolithiasis: He initially presented to me in November 2016 with two left ureteral calculi. He has a history of recurrent urolithiasis and has a family history of urolithiasis.    Interval history:    He follows up today for further evaluation of his left ureteral calculi. He has not passed any stones since his last visit. He denies any flank pain or hematuria. He has been completely asymptomatic. He has been compliant with straining of his urine.   Past Medical History Problems  1. History of gout (Z87.39) 2. History of hyperlipidemia (Z86.39)  Surgical History Problems  1. History of Appendectomy  Current Meds 1. Aspirin 81 MG TABS;  Therapy: (Recorded:11Nov2016) to Recorded 2. Lipitor 40 MG Oral Tablet;  Therapy: (Recorded:11Nov2016) to Recorded 3. Tamsulosin HCl - 0.4 MG Oral Capsule; Take 1 capsule by mouth at bedtime;  Therapy: 11Nov2016 to (Evaluate:25Nov2016)  Requested for: 11Nov2016; Last  Rx:11Nov2016 Ordered  Allergies Medication  1. No Known Drug Allergies  Family History Problems  1. Family history of malignant neoplasm of prostate (O96.29) : Father  Social History Problems  1. Alcohol use (Z78.9) 2. Married 3. Never a smoker  Vitals Vital Signs [Data Includes: Last 1 Day]  Recorded: 29Nov2016 04:18PM  Blood Pressure: 169 / 69 Heart Rate: 79  Physical Exam Constitutional: Well nourished and well developed . No acute distress.  Abdomen: No CVA tenderness.    Results/Data Urine [Data Includes: Last 1 Day]   29Nov2016  COLOR YELLOW   APPEARANCE CLEAR   SPECIFIC GRAVITY 1.025   pH 5.5   GLUCOSE NEGATIVE   BILIRUBIN NEGATIVE   KETONE NEGATIVE   BLOOD 2+   PROTEIN TRACE   NITRITE NEGATIVE   LEUKOCYTE ESTERASE TRACE   SQUAMOUS EPITHELIAL/HPF 0-5 HPF  WBC 6-10 WBC/HPF  RBC NONE SEEN RBC/HPF  BACTERIA FEW HPF  CRYSTALS NONE SEEN HPF  CASTS  NONE SEEN LPF  Yeast NONE SEEN HPF   I independently reviewed his KUB x-ray today. There are calcifications over the renal contours bilaterally in nonobstructing positions that likely represent nonobstructing stones. However, I am unable to visualize any clear calcifications over the expected course of the ureters bilaterally and particularly on the left side.   Assessment Assessed  1. Nephrolithiasis (N20.0) 2. Ureteral stone (N20.1)  Plan Health Maintenance  1. UA With REFLEX; [Do Not Release]; Status:Complete;   Done: 29Nov2016 04:03PM Ureteral stone  2. Follow-up Office  Follow-up  Status: Hold For - Date of Service  Requested for:  29Nov2016  Discussion/Summary 1. Left ureteral calculi: I'm still quite suspicious that his stones are still present although I cannot visualize them well on the x-ray. We have discussed multiple options including follow-up surveillance imaging versus proceeding with definitive treatment considering her strong suspicion that his stones remained present in the ureter. After discussion, he would like to tentatively schedule an elective procedure for cystoscopy, left retrograde pyelography, left ureteroscopy and possible laser lithotripsy and left ureteral stent placement for sometime over the next couple of weeks thereby giving him a chance at still been able to spontaneously pass his stones before that procedure. We have reviewed the potential risks and complications as well as expected recovery process associated with this procedure. He gives his informed consent. This will be scheduled.    2. Multiple bilateral renal calculi: I will recommend that he undergo a full metabolic evaluation following treatment of his  acute ureteral stone event.    Cc: Dr. Geri Seminoleibas Koirola     Signatures Electronically signed by : James Horn, M.D.; Sep 13 2015  5:30PM EST

## 2015-10-03 ENCOUNTER — Encounter (HOSPITAL_COMMUNITY): Payer: Self-pay | Admitting: *Deleted

## 2015-10-03 ENCOUNTER — Ambulatory Visit (HOSPITAL_COMMUNITY)
Admission: RE | Admit: 2015-10-03 | Discharge: 2015-10-03 | Disposition: A | Discharge status: Home / Self Care | Payer: 59 | Source: Ambulatory Visit | Attending: Urology | Admitting: Urology

## 2015-10-03 ENCOUNTER — Encounter (HOSPITAL_COMMUNITY)
Admission: RE | Disposition: A | Discharge status: Home / Self Care | Payer: Self-pay | Source: Ambulatory Visit | Attending: Urology

## 2015-10-03 ENCOUNTER — Ambulatory Visit (HOSPITAL_COMMUNITY): Payer: 59 | Admitting: Anesthesiology

## 2015-10-03 DIAGNOSIS — Z841 Family history of disorders of kidney and ureter: Secondary | ICD-10-CM | POA: Diagnosis not present

## 2015-10-03 DIAGNOSIS — N200 Calculus of kidney: Secondary | ICD-10-CM | POA: Insufficient documentation

## 2015-10-03 DIAGNOSIS — Z87442 Personal history of urinary calculi: Secondary | ICD-10-CM | POA: Insufficient documentation

## 2015-10-03 DIAGNOSIS — M109 Gout, unspecified: Secondary | ICD-10-CM | POA: Insufficient documentation

## 2015-10-03 DIAGNOSIS — E785 Hyperlipidemia, unspecified: Secondary | ICD-10-CM | POA: Diagnosis not present

## 2015-10-03 DIAGNOSIS — N201 Calculus of ureter: Secondary | ICD-10-CM | POA: Diagnosis present

## 2015-10-03 DIAGNOSIS — Z79899 Other long term (current) drug therapy: Secondary | ICD-10-CM | POA: Insufficient documentation

## 2015-10-03 DIAGNOSIS — Z7982 Long term (current) use of aspirin: Secondary | ICD-10-CM | POA: Insufficient documentation

## 2015-10-03 HISTORY — PX: CYSTOSCOPY WITH RETROGRADE PYELOGRAM, URETEROSCOPY AND STENT PLACEMENT: SHX5789

## 2015-10-03 SURGERY — CYSTOURETEROSCOPY, WITH RETROGRADE PYELOGRAM AND STENT INSERTION
Anesthesia: General | Laterality: Left

## 2015-10-03 MED ORDER — LIDOCAINE HCL (CARDIAC) 20 MG/ML IV SOLN
INTRAVENOUS | Status: AC
  Filled 2015-10-03: qty 5

## 2015-10-03 MED ORDER — CIPROFLOXACIN IN D5W 400 MG/200ML IV SOLN
400.0000 mg | INTRAVENOUS | Status: AC
  Administered 2015-10-03: 400 mg via INTRAVENOUS

## 2015-10-03 MED ORDER — HYDROCODONE-ACETAMINOPHEN 5-325 MG PO TABS: 1.0000 | ORAL_TABLET | Freq: Four times a day (QID) | ORAL | Status: AC | PRN

## 2015-10-03 MED ORDER — MIDAZOLAM HCL 5 MG/5ML IJ SOLN
INTRAMUSCULAR | Status: DC | PRN
  Administered 2015-10-03: 2 mg via INTRAVENOUS

## 2015-10-03 MED ORDER — IOHEXOL 300 MG/ML  SOLN
INTRAMUSCULAR | Status: DC | PRN
  Administered 2015-10-03: 5 mL via URETHRAL

## 2015-10-03 MED ORDER — PHENYLEPHRINE HCL 10 MG/ML IJ SOLN
INTRAMUSCULAR | Status: DC | PRN
  Administered 2015-10-03 (×7): 80 ug via INTRAVENOUS

## 2015-10-03 MED ORDER — PHENAZOPYRIDINE HCL 100 MG PO TABS: 100.0000 mg | ORAL_TABLET | Freq: Three times a day (TID) | ORAL | Status: AC | PRN

## 2015-10-03 MED ORDER — LACTATED RINGERS IV SOLN
INTRAVENOUS | Status: DC
  Administered 2015-10-03: 12:00:00 via INTRAVENOUS
  Administered 2015-10-03: 1000 mL via INTRAVENOUS
  Administered 2015-10-03: 11:00:00 via INTRAVENOUS

## 2015-10-03 MED ORDER — PHENYLEPHRINE 40 MCG/ML (10ML) SYRINGE FOR IV PUSH (FOR BLOOD PRESSURE SUPPORT)
PREFILLED_SYRINGE | INTRAVENOUS | Status: AC
  Filled 2015-10-03: qty 10

## 2015-10-03 MED ORDER — CIPROFLOXACIN HCL 250 MG PO TABS: 250.0000 mg | ORAL_TABLET | Freq: Two times a day (BID) | ORAL | Status: AC

## 2015-10-03 MED ORDER — SODIUM CHLORIDE 0.9 % IJ SOLN
INTRAMUSCULAR | Status: AC
  Filled 2015-10-03: qty 10

## 2015-10-03 MED ORDER — EPHEDRINE SULFATE 50 MG/ML IJ SOLN
INTRAMUSCULAR | Status: AC
  Filled 2015-10-03: qty 1

## 2015-10-03 MED ORDER — DEXAMETHASONE SODIUM PHOSPHATE 10 MG/ML IJ SOLN
INTRAMUSCULAR | Status: AC
  Filled 2015-10-03: qty 1

## 2015-10-03 MED ORDER — FENTANYL CITRATE (PF) 100 MCG/2ML IJ SOLN
INTRAMUSCULAR | Status: DC | PRN
  Administered 2015-10-03: 25 ug via INTRAVENOUS
  Administered 2015-10-03: 50 ug via INTRAVENOUS
  Administered 2015-10-03: 25 ug via INTRAVENOUS

## 2015-10-03 MED ORDER — SODIUM CHLORIDE 0.9 % IR SOLN
Status: DC | PRN
  Administered 2015-10-03: 5000 mL

## 2015-10-03 MED ORDER — 0.9 % SODIUM CHLORIDE (POUR BTL) OPTIME
TOPICAL | Status: DC | PRN
  Administered 2015-10-03: 1000 mL

## 2015-10-03 MED ORDER — ONDANSETRON HCL 4 MG/2ML IJ SOLN
INTRAMUSCULAR | Status: AC
  Filled 2015-10-03: qty 2

## 2015-10-03 MED ORDER — MIDAZOLAM HCL 2 MG/2ML IJ SOLN
INTRAMUSCULAR | Status: AC
  Filled 2015-10-03: qty 2

## 2015-10-03 MED ORDER — PROPOFOL 10 MG/ML IV BOLUS
INTRAVENOUS | Status: DC | PRN
  Administered 2015-10-03: 200 mg via INTRAVENOUS

## 2015-10-03 MED ORDER — FENTANYL CITRATE (PF) 100 MCG/2ML IJ SOLN
INTRAMUSCULAR | Status: AC
  Filled 2015-10-03: qty 2

## 2015-10-03 MED ORDER — ONDANSETRON HCL 4 MG/2ML IJ SOLN
INTRAMUSCULAR | Status: DC | PRN
  Administered 2015-10-03: 4 mg via INTRAVENOUS

## 2015-10-03 MED ORDER — LIDOCAINE HCL (CARDIAC) 20 MG/ML IV SOLN
INTRAVENOUS | Status: DC | PRN
  Administered 2015-10-03: 50 mg via INTRAVENOUS

## 2015-10-03 MED ORDER — PROPOFOL 10 MG/ML IV BOLUS
INTRAVENOUS | Status: AC
  Filled 2015-10-03: qty 20

## 2015-10-03 MED ORDER — DEXAMETHASONE SODIUM PHOSPHATE 10 MG/ML IJ SOLN
INTRAMUSCULAR | Status: DC | PRN
  Administered 2015-10-03: 10 mg via INTRAVENOUS

## 2015-10-03 SURGICAL SUPPLY — 19 items
BAG URO CATCHER STRL LF (MISCELLANEOUS) ×2 IMPLANT
BASKET ZERO TIP NITINOL 2.4FR (BASKET) ×1 IMPLANT
BSKT STON RTRVL ZERO TP 2.4FR (BASKET) ×1
CATH INTERMIT  6FR 70CM (CATHETERS) ×2 IMPLANT
CLOTH BEACON ORANGE TIMEOUT ST (SAFETY) ×2 IMPLANT
FIBER LASER FLEXIVA 1000 (UROLOGICAL SUPPLIES) IMPLANT
FIBER LASER FLEXIVA 200 (UROLOGICAL SUPPLIES) IMPLANT
FIBER LASER FLEXIVA 365 (UROLOGICAL SUPPLIES) IMPLANT
FIBER LASER FLEXIVA 550 (UROLOGICAL SUPPLIES) IMPLANT
FIBER LASER TRAC TIP (UROLOGICAL SUPPLIES) IMPLANT
GLOVE BIOGEL M STRL SZ7.5 (GLOVE) ×2 IMPLANT
GOWN STRL REUS W/TWL LRG LVL3 (GOWN DISPOSABLE) ×4 IMPLANT
GUIDEWIRE ANG ZIPWIRE 038X150 (WIRE) IMPLANT
GUIDEWIRE STR DUAL SENSOR (WIRE) ×3 IMPLANT
MANIFOLD NEPTUNE II (INSTRUMENTS) ×2 IMPLANT
PACK CYSTO (CUSTOM PROCEDURE TRAY) ×2 IMPLANT
SHEATH ACCESS URETERAL 38CM (SHEATH) ×1 IMPLANT
STENT URET 6FRX24 CONTOUR (STENTS) ×1 IMPLANT
TUBING CONNECTING 10 (TUBING) ×2 IMPLANT

## 2015-10-03 NOTE — Transfer of Care (Signed)
Immediate Anesthesia Transfer of Care Note  Patient: James Horn  Procedure(s) Performed: Procedure(s): CYSTOSCOPY WITH RETROGRADE PYELOGRAM, URETEROSCOPY AND STENT PLACEMENT (Left)  Patient Location: PACU  Anesthesia Type:General  Level of Consciousness: awake, sedated and responds to stimulation  Airway & Oxygen Therapy: Patient Spontanous Breathing and Patient connected to face mask oxygen  Post-op Assessment: Report given to RN and Post -op Vital signs reviewed and stable  Post vital signs: Reviewed and stable  Last Vitals: There were no vitals filed for this visit.  Complications: No apparent anesthesia complications

## 2015-10-03 NOTE — Anesthesia Postprocedure Evaluation (Signed)
Anesthesia Post Note  Patient: James Horn  Procedure(s) Performed: Procedure(s) (LRB): CYSTOSCOPY WITH RETROGRADE PYELOGRAM, URETEROSCOPY AND STENT PLACEMENT (Left)  Patient location during evaluation: PACU Anesthesia Type: General Level of consciousness: awake and alert Pain management: pain level controlled Vital Signs Assessment: post-procedure vital signs reviewed and stable Respiratory status: spontaneous breathing, nonlabored ventilation, respiratory function stable and patient connected to nasal cannula oxygen Cardiovascular status: blood pressure returned to baseline and stable Postop Assessment: no signs of nausea or vomiting Anesthetic complications: no    Last Vitals:  Filed Vitals:   10/03/15 1245 10/03/15 1259  BP: 132/76 137/77  Pulse: 56   Temp:  36.3 C  Resp: 22 18    Last Pain: There were no vitals filed for this visit.               Shelton SilvasKevin D Sonita Michiels

## 2015-10-03 NOTE — Op Note (Signed)
Preoperative diagnosis: Left ureteral calculi  Postoperative diagnosis: Left renal calculi  Procedure:  1. Cystoscopy 2. Left ureteroscopy and stone removal 3. Left ureteral stent placement (6 x 24) - with string 4. Left retrograde pyelography with interpretation  Surgeon: James Horn, Jr. M.D.  Anesthesia: General  Complications: None  Intraoperative findings: Left retrograde pyelography demonstrated a normal caliber ureteral and no filling defects in the renal collecting system without other abnormalities noted.  EBL: Minimal  Specimens: 1. Left renal calculi  Disposition of specimens: [facility] for stone analysis  Indication: James Horn  is a 59 y.o. patient with urolithiasis. He has a history of two proximal left ureteral calculi.  He was initially managed with medical expulsion therapy but did not pass any stones.  After reviewing the management options for treatment, they elected to proceed with the above surgical procedure(s). We have discussed the potential benefits and risks of the procedure, side effects of the proposed treatment, the likelihood of the patient achieving the goals of the procedure, and any potential problems that might occur during the procedure or recuperation. Informed consent has been obtained.  Description of procedure:  The patient was taken to the operating room and general anesthesia was induced.  The patient was placed in the dorsal lithotomy position, prepped and draped in the usual sterile fashion, and preoperative antibiotics were administered. A preoperative time-out was performed.   Cystourethroscopy was performed.  The patient's urethra was examined and was normal. The bladder was then systematically examined in its entirety. There was no evidence for any bladder tumors, stones, or other mucosal pathology.    Attention then turned to the left ureteral orifice and a ureteral catheter was used to intubate the ureteral  orifice.  Omnipaque contrast was injected through the 6 Fr ureteral catheter and a retrograde pyelogram was performed with findings as dictated above.  Although no ureteral filling defects were noted, based on the patient's history and the fact he has not passed any stones as well as his equivocal KUB findings, I elected to perform ureteroscopy.  A 0.38 sensor guidewire was then advanced up the left ureter into the renal pelvis under fluoroscopic guidance.  After confirming there were no distal stones with a 6 Fr semirigid scope, a 12/14 Fr ureteral access sheath was then advanced over the guide wire. The digital flexible ureteroscope was then advanced through the access sheath into the ureter next to the guidewire.  No ureteral stones were identified.  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  I located a few small stones and these were removed with a 0 tip nitinol basket for stone analysis.  There were papillary tip calcifications noted in multiple calyces as well.  The safety wire was then replaced and the access sheath removed.  The guidewire was backloaded through the cystoscope and a ureteral stent was advance over the wire using Seldinger technique.  The stent was positioned appropriately under fluoroscopic and cystoscopic guidance.  The wire was then removed with an adequate stent curl noted in the renal pelvis as well as in the bladder.  The bladder was then emptied and the procedure ended.  The patient appeared to tolerate the procedure well and without complications.  The patient was able to be awakened and transferred to the recovery unit in satisfactory condition.   James Horn, James Horn

## 2015-10-03 NOTE — Anesthesia Preprocedure Evaluation (Addendum)
Anesthesia Evaluation  Patient identified by MRN, date of birth, ID band Patient awake    Reviewed: Allergy & Precautions, NPO status , Patient's Chart, lab work & pertinent test results  Airway Mallampati: II  TM Distance: >3 FB Neck ROM: Full    Dental  (+) Partial Lower   Pulmonary neg pulmonary ROS,    breath sounds clear to auscultation       Cardiovascular negative cardio ROS   Rhythm:Regular Rate:Normal     Neuro/Psych negative neurological ROS  negative psych ROS   GI/Hepatic negative GI ROS, Neg liver ROS,   Endo/Other    Renal/GU   negative genitourinary   Musculoskeletal negative musculoskeletal ROS (+)   Abdominal   Peds negative pediatric ROS (+)  Hematology negative hematology ROS (+)   Anesthesia Other Findings   Reproductive/Obstetrics negative OB ROS                            Lab Results  Component Value Date   WBC 8.6 09/28/2015   HGB 12.0* 09/28/2015   HCT 36.9* 09/28/2015   MCV 87.6 09/28/2015   PLT 338 09/28/2015   Lab Results  Component Value Date   CREATININE 1.08 09/28/2015   BUN 18 09/28/2015   NA 138 09/28/2015   K 3.8 09/28/2015   CL 103 09/28/2015   CO2 27 09/28/2015   No results found for: INR, PROTIME   Anesthesia Physical Anesthesia Plan  ASA: II  Anesthesia Plan: General   Post-op Pain Management:    Induction: Intravenous  Airway Management Planned: LMA  Additional Equipment:   Intra-op Plan:   Post-operative Plan: Extubation in OR  Informed Consent: I have reviewed the patients History and Physical, chart, labs and discussed the procedure including the risks, benefits and alternatives for the proposed anesthesia with the patient or authorized representative who has indicated his/her understanding and acceptance.   Dental advisory given  Plan Discussed with: CRNA  Anesthesia Plan Comments:         Anesthesia Quick  Evaluation

## 2015-10-03 NOTE — Discharge Instructions (Addendum)
1. You may see some blood in the urine and may have some burning with urination for 48-72 hours. You also may notice that you have to urinate more frequently or urgently after your procedure which is normal.  2. You should call should you develop an inability urinate, fever > 101, persistent nausea and vomiting that prevents you from eating or drinking to stay hydrated.  3. If you have a stent, you will likely urinate more frequently and urgently until the stent is removed and you may experience some discomfort/pain in the lower abdomen and flank especially when urinating. You may take pain medication prescribed to you if needed for pain. You may also intermittently have blood in the urine until the stent is removed. 4. You may remove your stent Friday morning.  This can be done by simply pulling the string coming out the urethra.  This may be best accomplished in the shower or bathroom as some urine will also come out.  You may experience some discomfort or pain after the stent has been removed.  If so, this is usually just temporary and will typically resolve after taking pain medication.       Ureteral Stent Implantation, Care After Refer to this sheet in the next few weeks. These instructions provide you with information on caring for yourself after your procedure. Your health care provider may also give you more specific instructions. Your treatment has been planned according to current medical practices, but problems sometimes occur. Call your health care provider if you have any problems or questions after your procedure. WHAT TO EXPECT AFTER THE PROCEDURE You should be back to normal activity within 48 hours after the procedure. Nausea and vomiting may occur and are commonly the result of anesthesia. It is common to experience sharp pain in the back or lower abdomen and penis with voiding. This is caused by movement of the ends of the stent with the act of urinating.It usually goes away within  minutes after you have stopped urinating. HOME CARE INSTRUCTIONS Make sure to drink plenty of fluids. You may have small amounts of bleeding, causing your urine to be red. This is normal. Certain movements may trigger pain or a feeling that you need to urinate. You may be given medicines to prevent infection or bladder spasms. Be sure to take all medicines as directed. Only take over-the-counter or prescription medicines for pain, discomfort, or fever as directed by your health care provider. Do not take aspirin, as this can make bleeding worse. Your stent will be left in until the blockage is resolved. This may take 2 weeks or longer, depending on the reason for stent implantation. You may have an X-ray exam to make sure your ureter is open and that the stent has not moved out of position (migrated). The stent can be removed by your health care provider in the office. Medicines may be given for comfort while the stent is being removed. Be sure to keep all follow-up appointments so your health care provider can check that you are healing properly. SEEK MEDICAL CARE IF:  You experience increasing pain.  Your pain medicine is not working. SEEK IMMEDIATE MEDICAL CARE IF:  Your urine is dark red or has blood clots.  You are leaking urine (incontinent).  You have a fever, chills, feeling sick to your stomach (nausea), or vomiting.  Your pain is not relieved by pain medicine.  The end of the stent comes out of the urethra.  You are unable to  urinate.   This information is not intended to replace advice given to you by your health care provider. Make sure you discuss any questions you have with your health care provider.   Document Released: 06/03/2013 Document Revised: 10/06/2013 Document Reviewed: 04/15/2015 Elsevier Interactive Patient Education [employer]2016 [employer] Inc.

## 2015-10-03 NOTE — Anesthesia Procedure Notes (Signed)
Procedure Name: LMA Insertion Date/Time: 10/03/2015 11:06 AM Performed by: Elyn PeersALLEN, Aubery Douthat J Pre-anesthesia Checklist: Patient identified, Emergency Drugs available, Suction available, Patient being monitored and Timeout performed Patient Re-evaluated:Patient Re-evaluated prior to inductionOxygen Delivery Method: Circle system utilized Preoxygenation: Pre-oxygenation with 100% oxygen Intubation Type: IV induction Ventilation: Mask ventilation without difficulty LMA: LMA inserted Laryngoscope Size: 5 Number of attempts: 1 Placement Confirmation: positive ETCO2 and breath sounds checked- equal and bilateral Tube secured with: Tape Dental Injury: Teeth and Oropharynx as per pre-operative assessment

## 2015-10-03 NOTE — Interval H&P Note (Signed)
History and Physical Interval Note:  10/03/2015 10:33 AM  James Horn  has presented today for surgery, with the diagnosis of LEFT URETERAL CALCULI  The various methods of treatment have been discussed with the patient and family. After consideration of risks, benefits and other options for treatment, the patient has consented to  Procedure(s): CYSTOSCOPY WITH RETROGRADE PYELOGRAM, URETEROSCOPY AND STENT PLACEMENT (Left) HOLMIUM LASER APPLICATION (Left) as a surgical intervention .  The patient's history has been reviewed, patient examined, no change in status, stable for surgery.  I have reviewed the patient's chart and labs.  Questions were answered to the patient's satisfaction.     Keilyn Haggard,LES

## 2015-10-04 ENCOUNTER — Encounter (HOSPITAL_COMMUNITY): Payer: Self-pay | Admitting: Urology

## 2015-11-01 DIAGNOSIS — N200 Calculus of kidney: Secondary | ICD-10-CM | POA: Diagnosis not present

## 2015-11-01 DIAGNOSIS — Z0000 Encounter for general adult medical examination without abnormal findings: Secondary | ICD-10-CM | POA: Diagnosis not present

## 2015-11-01 DIAGNOSIS — N201 Calculus of ureter: Secondary | ICD-10-CM | POA: Diagnosis not present

## 2016-03-01 IMAGING — CR DG CHEST 2V
2 series · 2 of 2 positions shown · non-contrast
Comparison: None.

CLINICAL DATA: Preop kidney stone removal.

EXAM:
CHEST  2 VIEW

[w chest pa]
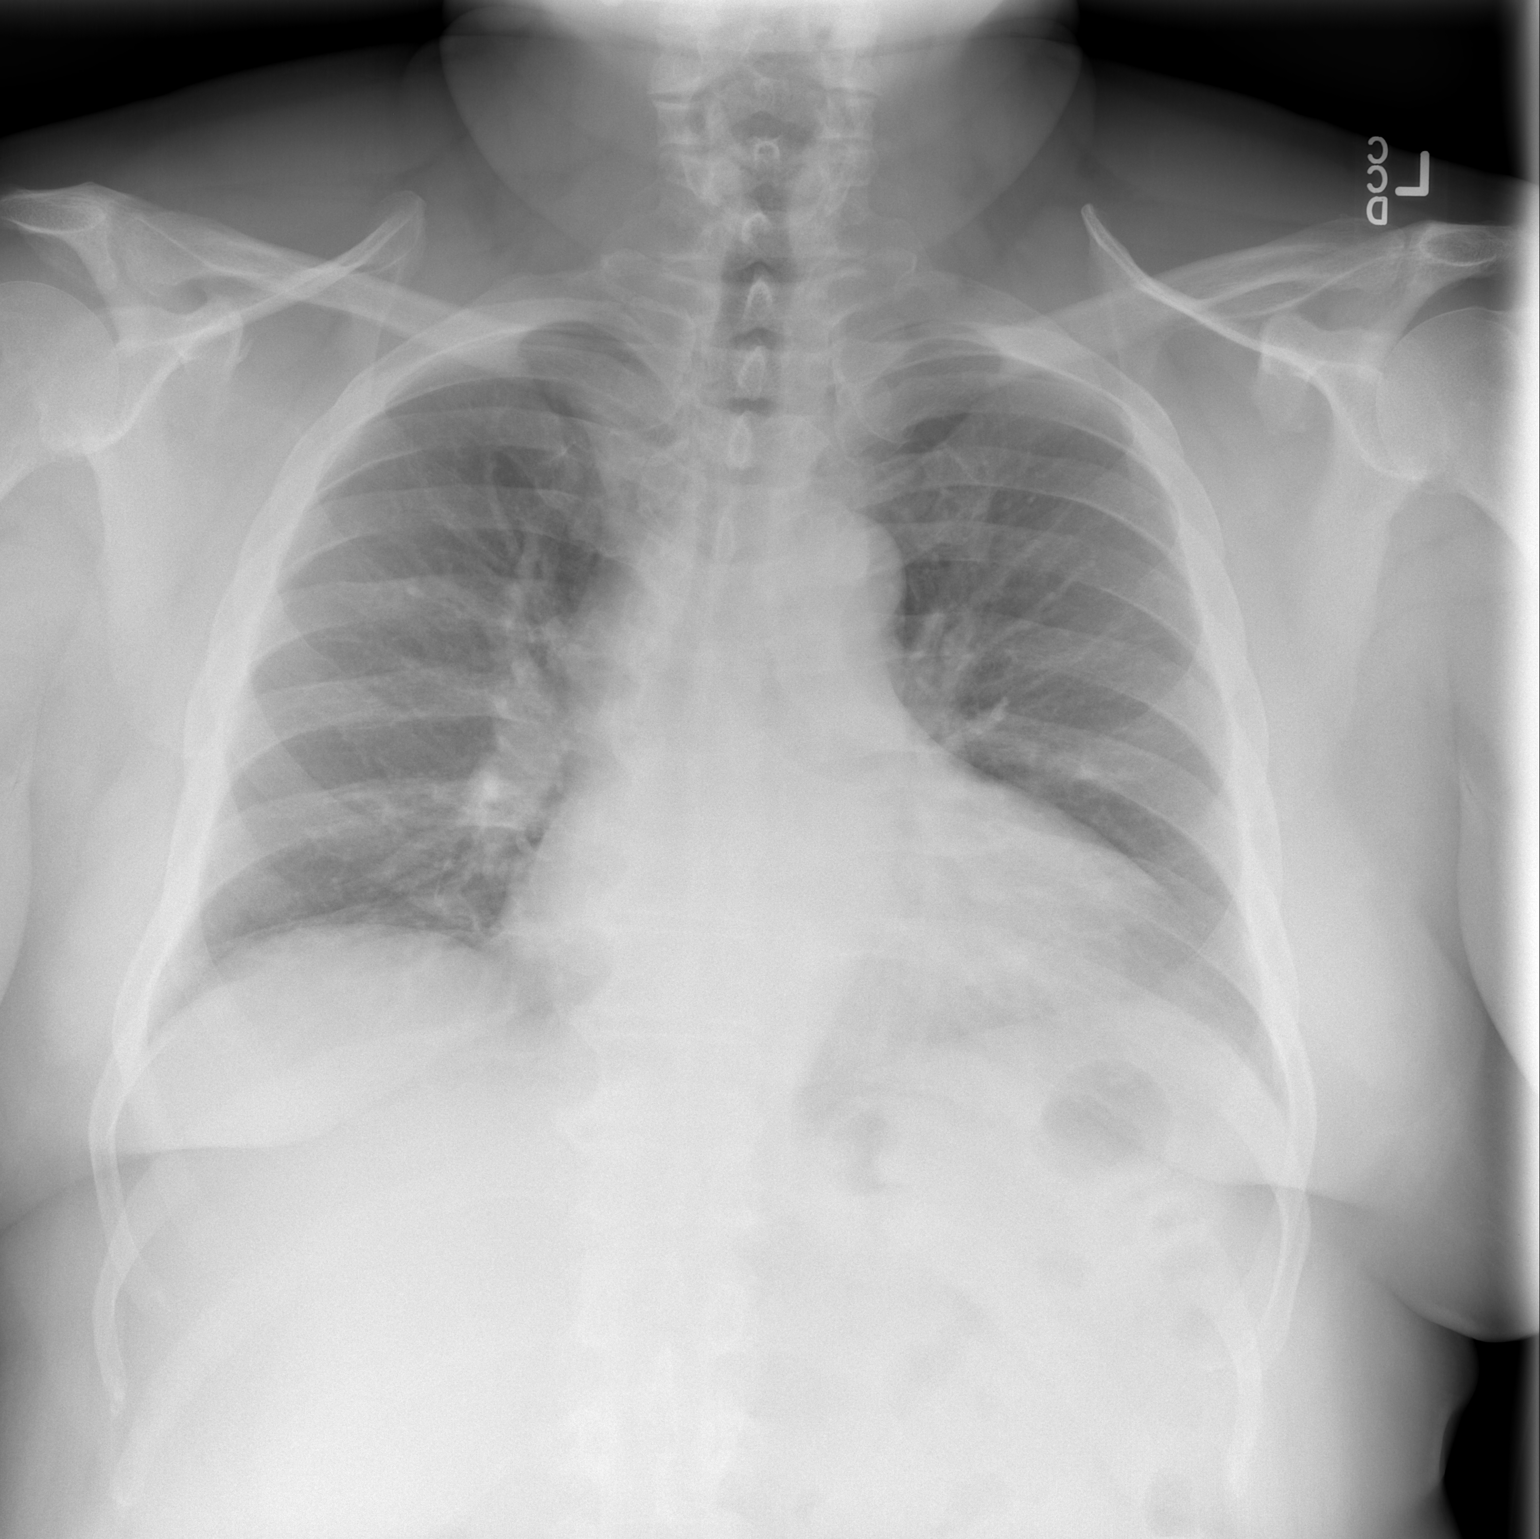

[w chest lat]
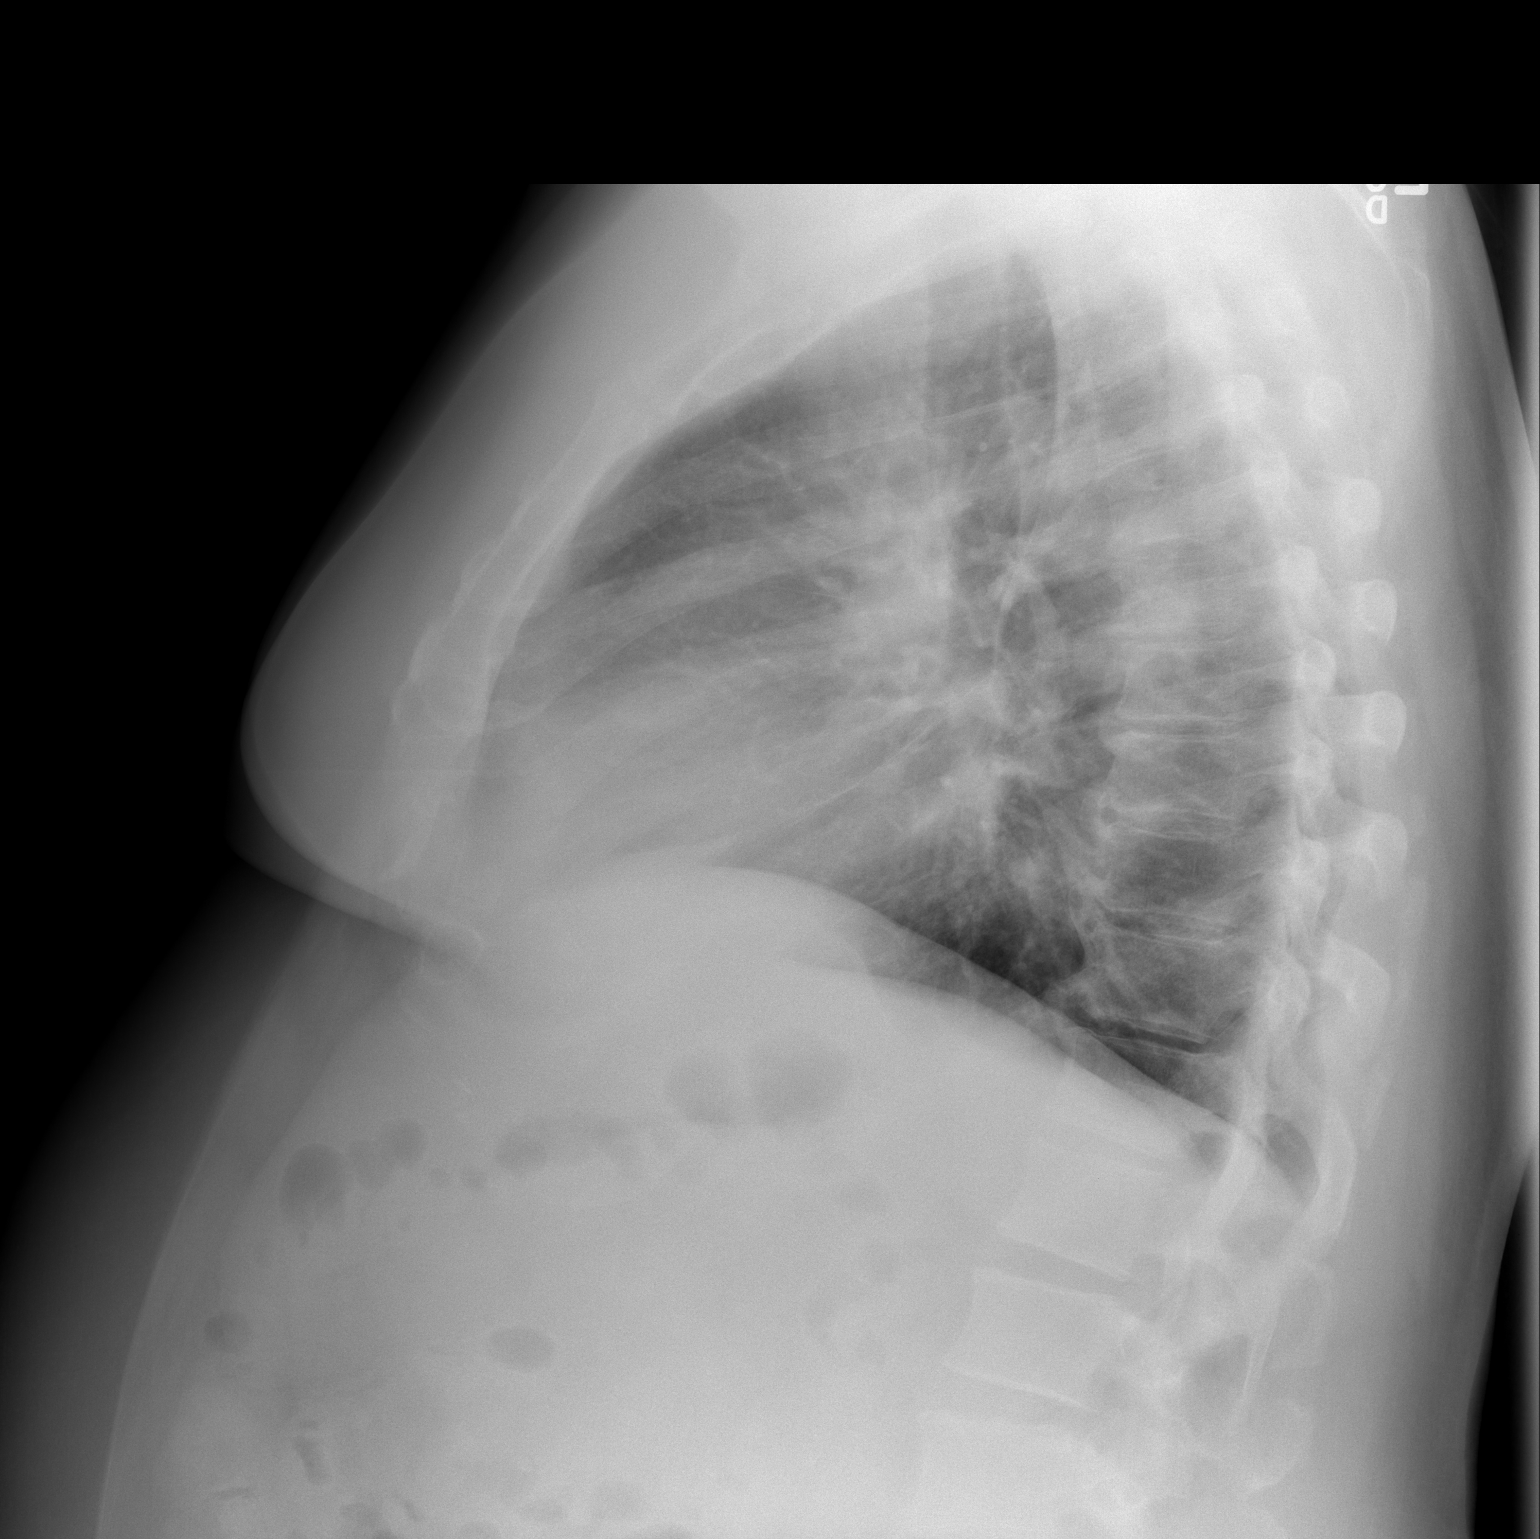

[2 of 2 positions shown; findings below may reference images not displayed]

FINDINGS: Trachea is midline. Heart is at the upper limits normal in size.
Lungs are low in volume but clear. No pleural fluid.
IMPRESSION: No acute findings.

## 2016-11-21 DIAGNOSIS — N529 Male erectile dysfunction, unspecified: Secondary | ICD-10-CM | POA: Diagnosis not present

## 2016-11-21 DIAGNOSIS — Z125 Encounter for screening for malignant neoplasm of prostate: Secondary | ICD-10-CM | POA: Diagnosis not present

## 2016-11-21 DIAGNOSIS — I10 Essential (primary) hypertension: Secondary | ICD-10-CM | POA: Diagnosis not present

## 2016-11-21 DIAGNOSIS — Z0000 Encounter for general adult medical examination without abnormal findings: Secondary | ICD-10-CM | POA: Diagnosis not present

## 2016-11-21 DIAGNOSIS — Z23 Encounter for immunization: Secondary | ICD-10-CM | POA: Diagnosis not present

## 2016-11-21 DIAGNOSIS — E119 Type 2 diabetes mellitus without complications: Secondary | ICD-10-CM | POA: Diagnosis not present

## 2016-11-21 DIAGNOSIS — M1A9XX0 Chronic gout, unspecified, without tophus (tophi): Secondary | ICD-10-CM | POA: Diagnosis not present

## 2016-11-21 DIAGNOSIS — E7800 Pure hypercholesterolemia, unspecified: Secondary | ICD-10-CM | POA: Diagnosis not present

## 2016-11-21 MED FILL — ATORVASTATIN 40 MG TABLET: 40 | 30 days supply | Qty: 30 | Fill #0

## 2016-11-21 MED FILL — VALSARTAN 80 MG TABLET: 80 | 30 days supply | Qty: 30 | Fill #0

## 2016-12-05 DIAGNOSIS — I10 Essential (primary) hypertension: Secondary | ICD-10-CM | POA: Diagnosis not present

## 2016-12-25 MED FILL — VALSARTAN 80 MG TABLET: 80 | 30 days supply | Qty: 30 | Fill #1

## 2016-12-25 MED FILL — ATORVASTATIN 40 MG TABLET: 40 | 30 days supply | Qty: 30 | Fill #1

## 2017-01-16 MED FILL — GAVILYTE-N SOLUTION: 420 | 1 days supply | Qty: 4000 | Fill #0

## 2017-01-22 DIAGNOSIS — K633 Ulcer of intestine: Secondary | ICD-10-CM | POA: Diagnosis not present

## 2017-01-22 DIAGNOSIS — K5289 Other specified noninfective gastroenteritis and colitis: Secondary | ICD-10-CM | POA: Diagnosis not present

## 2017-01-22 DIAGNOSIS — Z1211 Encounter for screening for malignant neoplasm of colon: Secondary | ICD-10-CM | POA: Diagnosis not present

## 2017-01-23 MED FILL — ATORVASTATIN 40 MG TABLET: 40 | 30 days supply | Qty: 30 | Fill #2

## 2017-01-23 MED FILL — VALSARTAN 80 MG TABLET: 80 | 30 days supply | Qty: 30 | Fill #2

## 2017-02-20 DIAGNOSIS — Z7984 Long term (current) use of oral hypoglycemic drugs: Secondary | ICD-10-CM | POA: Diagnosis not present

## 2017-02-20 DIAGNOSIS — E119 Type 2 diabetes mellitus without complications: Secondary | ICD-10-CM | POA: Diagnosis not present

## 2017-02-20 DIAGNOSIS — I10 Essential (primary) hypertension: Secondary | ICD-10-CM | POA: Diagnosis not present

## 2017-02-20 DIAGNOSIS — E7800 Pure hypercholesterolemia, unspecified: Secondary | ICD-10-CM | POA: Diagnosis not present

## 2017-02-27 MED FILL — VALSARTAN 80 MG TABLET: 80 | 30 days supply | Qty: 30 | Fill #3

## 2017-02-27 MED FILL — ATORVASTATIN 40 MG TABLET: 40 | 30 days supply | Qty: 30 | Fill #3

## 2017-03-07 DIAGNOSIS — M109 Gout, unspecified: Secondary | ICD-10-CM | POA: Diagnosis not present

## 2017-03-08 MED FILL — COLCHICINE 0.6 MG TABLET: 0.6 | 7 days supply | Qty: 15 | Fill #0

## 2017-03-08 MED FILL — INDOMETHACIN 25 MG CAPSULE: 25 | 7 days supply | Qty: 30 | Fill #0

## 2017-09-02 DIAGNOSIS — E1165 Type 2 diabetes mellitus with hyperglycemia: Secondary | ICD-10-CM | POA: Diagnosis not present

## 2017-09-02 DIAGNOSIS — Z7984 Long term (current) use of oral hypoglycemic drugs: Secondary | ICD-10-CM | POA: Diagnosis not present

## 2017-09-02 DIAGNOSIS — E7800 Pure hypercholesterolemia, unspecified: Secondary | ICD-10-CM | POA: Diagnosis not present

## 2017-09-02 DIAGNOSIS — I10 Essential (primary) hypertension: Secondary | ICD-10-CM | POA: Diagnosis not present

## 2019-02-20 MED FILL — ATORVASTATIN 20 MG TABLET: 20 | 90 days supply | Qty: 90 | Fill #0

## 2019-02-20 MED FILL — VALSARTAN 320 MG TAB: 320 | 30 days supply | Qty: 30 | Fill #0

## 2019-02-20 MED FILL — SILDENAFIL CITRATE 100 MG T: 100 | 30 days supply | Qty: 6 | Fill #0

## 2019-06-01 ENCOUNTER — Other Ambulatory Visit: Payer: Self-pay

## 2019-06-01 ENCOUNTER — Encounter (HOSPITAL_COMMUNITY): Payer: Self-pay

## 2019-06-01 ENCOUNTER — Emergency Department (HOSPITAL_COMMUNITY)
Admission: EM | Admit: 2019-06-01 | Discharge: 2019-06-01 | Disposition: A | Discharge status: Home / Self Care | Payer: No Typology Code available for payment source | Attending: Emergency Medicine | Admitting: Emergency Medicine

## 2019-06-01 ENCOUNTER — Emergency Department (HOSPITAL_COMMUNITY): Payer: No Typology Code available for payment source

## 2019-06-01 DIAGNOSIS — Z79899 Other long term (current) drug therapy: Secondary | ICD-10-CM | POA: Insufficient documentation

## 2019-06-01 DIAGNOSIS — Z7982 Long term (current) use of aspirin: Secondary | ICD-10-CM | POA: Insufficient documentation

## 2019-06-01 DIAGNOSIS — S0181XA Laceration without foreign body of other part of head, initial encounter: Secondary | ICD-10-CM | POA: Insufficient documentation

## 2019-06-01 DIAGNOSIS — Y939 Activity, unspecified: Secondary | ICD-10-CM | POA: Insufficient documentation

## 2019-06-01 DIAGNOSIS — S01511A Laceration without foreign body of lip, initial encounter: Secondary | ICD-10-CM | POA: Diagnosis not present

## 2019-06-01 DIAGNOSIS — Z23 Encounter for immunization: Secondary | ICD-10-CM | POA: Insufficient documentation

## 2019-06-01 DIAGNOSIS — S0990XA Unspecified injury of head, initial encounter: Secondary | ICD-10-CM | POA: Diagnosis present

## 2019-06-01 DIAGNOSIS — S025XXA Fracture of tooth (traumatic), initial encounter for closed fracture: Secondary | ICD-10-CM | POA: Insufficient documentation

## 2019-06-01 DIAGNOSIS — W19XXXA Unspecified fall, initial encounter: Secondary | ICD-10-CM

## 2019-06-01 DIAGNOSIS — W108XXA Fall (on) (from) other stairs and steps, initial encounter: Secondary | ICD-10-CM | POA: Diagnosis not present

## 2019-06-01 DIAGNOSIS — Y929 Unspecified place or not applicable: Secondary | ICD-10-CM | POA: Insufficient documentation

## 2019-06-01 DIAGNOSIS — Y999 Unspecified external cause status: Secondary | ICD-10-CM | POA: Diagnosis not present

## 2019-06-01 DIAGNOSIS — S80211A Abrasion, right knee, initial encounter: Secondary | ICD-10-CM | POA: Diagnosis not present

## 2019-06-01 DIAGNOSIS — S60512A Abrasion of left hand, initial encounter: Secondary | ICD-10-CM | POA: Diagnosis not present

## 2019-06-01 MED ORDER — TETANUS-DIPHTH-ACELL PERTUSSIS 5-2.5-18.5 LF-MCG/0.5 IM SUSP
0.5000 mL | Freq: Once | INTRAMUSCULAR | Status: AC
  Administered 2019-06-01: 0.5 mL via INTRAMUSCULAR
  Filled 2019-06-01: qty 0.5

## 2019-06-01 MED ORDER — ACETAMINOPHEN 325 MG PO TABS
650.0000 mg | ORAL_TABLET | Freq: Once | ORAL | Status: AC
  Administered 2019-06-01: 20:00:00 650 mg via ORAL
  Filled 2019-06-01: qty 2

## 2019-06-01 MED ORDER — LIDOCAINE HCL (PF) 1 % IJ SOLN
5.0000 mL | Freq: Once | INTRAMUSCULAR | Status: AC
  Administered 2019-06-01: 5 mL via INTRADERMAL
  Filled 2019-06-01: qty 30

## 2019-06-01 NOTE — ED Provider Notes (Signed)
[facility] DEPT Provider Note   CSN: [id number] Arrival date & time: 06/01/19  7846    History   Chief Complaint Chief Complaint  Patient presents with   Fall    HPI James Horn is a 63 y.o. male.     63 y.o male with a PMH of HTN presents to the ED s/p fall PTA. Patient reports he was going up the steps once he missed 1 of the steps, he then proceeded to land on his head, states he has a laceration to his forehead along with a lip laceration.  Also has some abrasions on his left hand along with right knee.  Denies any headache, states he was ambulatory after the incident, has cleaned his wounds with water.  Patient reports his last tetanus vaccine was within 5 years.  Denies any headache, loss of consciousness, currently on no blood thinners.  The history is provided by the patient.  Fall Pertinent negatives include no chest pain, no abdominal pain, no headaches and no shortness of breath.    Past Medical History:  Diagnosis Date   Kidney stones    Nocturia     There are no active problems to display for this patient.   Past Surgical History:  Procedure Laterality Date   APPENDECTOMY     CYSTOSCOPY WITH RETROGRADE PYELOGRAM, URETEROSCOPY AND STENT PLACEMENT Left 10/03/2015   Procedure: CYSTOSCOPY WITH RETROGRADE PYELOGRAM, URETEROSCOPY AND STENT PLACEMENT;  Surgeon: Raynelle Bring, MD;  Location: [facility];  Service: Urology;  Laterality: Left;        Home Medications    Prior to Admission medications   Medication Sig Start Date End Date Taking? Authorizing Provider  aspirin EC 81 MG tablet Take 81 mg by mouth daily.    [provider]  atorvastatin (LIPITOR) 40 MG tablet Take 40 mg by mouth at bedtime. 06/17/15   [provider]  ciprofloxacin (CIPRO) 250 MG tablet Take 1 tablet (250 mg total) by mouth 2 (two) times daily. 10/03/15   Raynelle Bring, MD  HYDROcodone-acetaminophen (NORCO/VICODIN) 5-325 MG tablet  Take 1-2 tablets by mouth every 6 (six) hours as needed. 10/03/15   Raynelle Bring, MD  ibuprofen (ADVIL,MOTRIN) 200 MG tablet Take 200 mg by mouth every 6 (six) hours as needed.    [provider]  phenazopyridine (PYRIDIUM) 100 MG tablet Take 1 tablet (100 mg total) by mouth 3 (three) times daily as needed for pain (for burning). 10/03/15   Raynelle Bring, MD  VIAGRA 100 MG tablet TAKE 1/2 OR 1 TABLET AS NEEDED 1HOUR BEFORE INTERCOURSE. (NOT FOR DAILY USE) ORALLY 30 DAYS 08/19/15   [provider]    Family History No family history on file.  Social History Social History   Tobacco Use   Smoking status: Never Smoker   Smokeless tobacco: Never Used  Substance Use Topics   Alcohol use: Yes    Comment: occasional   Drug use: No     Allergies   Patient has no known allergies.   Review of Systems Review of Systems  Constitutional: Negative for chills and fever.  HENT: Negative for ear pain and sore throat.   Eyes: Negative for pain and visual disturbance.  Respiratory: Negative for cough and shortness of breath.   Cardiovascular: Negative for chest pain and palpitations.  Gastrointestinal: Negative for abdominal pain and vomiting.  Genitourinary: Negative for dysuria and hematuria.  Musculoskeletal: Negative for arthralgias, back pain and neck pain.  Skin: Positive for wound.  Negative for color change and rash.  Neurological: Negative for seizures, syncope, weakness, light-headedness and headaches.  All other systems reviewed and are negative.    Physical Exam Updated Vital Signs BP (!) 174/96    Pulse 64    Temp 98.1 F (36.7 C) (Oral)    Resp 18    Wt 125 kg    SpO2 93%    BMI 41.90 kg/m   Physical Exam Vitals signs and nursing note reviewed.  Constitutional:      Appearance: He is well-developed.  HENT:     Head: Normocephalic.      Mouth/Throat:     Dentition: Abnormal dentition.      Comments: Frontal right tooth appears chipped. Eyes:      General: No scleral icterus.    Pupils: Pupils are equal, round, and reactive to light.  Neck:     Musculoskeletal: Normal range of motion.  Cardiovascular:     Heart sounds: Normal heart sounds.  Pulmonary:     Effort: Pulmonary effort is normal.     Breath sounds: Normal breath sounds. No wheezing.  Chest:     Chest wall: No tenderness.  Abdominal:     General: Bowel sounds are normal. There is no distension.     Palpations: Abdomen is soft.     Tenderness: There is no abdominal tenderness.  Musculoskeletal:        General: No tenderness or deformity.     Right knee: He exhibits erythema.       Hands:       Legs:  Skin:    General: Skin is warm and dry.  Neurological:     Mental Status: He is alert and oriented to person, place, and time.      ED Treatments / Results  Labs (all labs ordered are listed, but only abnormal results are displayed) Labs Reviewed - No data to display  EKG None  Radiology Ct Head Wo Contrast  Result Date: 06/01/2019 CLINICAL DATA:  Pt states he was going up the stairs and tripped. Pt has 1.5 inch lac on right forehead, small lac on lip, chipped right front upper tooth, abrasion on knee, abrasion on left pinky and middle finger. EXAM: CT HEAD WITHOUT CONTRAST CT CERVICAL SPINE WITHOUT CONTRAST TECHNIQUE: Multidetector CT imaging of the head and cervical spine was performed following the standard protocol without intravenous contrast. Multiplanar CT image reconstructions of the cervical spine were also generated. COMPARISON:  None. FINDINGS: CT HEAD FINDINGS Brain: No evidence of acute infarction, hemorrhage, hydrocephalus, extra-axial collection or mass lesion/mass effect. Vascular: No hyperdense vessel or unexpected calcification. Skull: Normal. Negative for fracture or focal lesion. Sinuses/Orbits: No acute finding. Other: RIGHT frontal scalp edema not associated with fracture. CT CERVICAL SPINE FINDINGS Alignment: There is loss of cervical  lordosis. This may be secondary to splinting, soft tissue injury, or positioning. Otherwise alignment is normal. Skull base and vertebrae: No acute fracture. No primary bone lesion or focal pathologic process. Soft tissues and spinal canal: No prevertebral fluid or swelling. No visible canal hematoma. Disc levels:  Disc height loss noted at C5-6. Upper chest: Negative. Other: None IMPRESSION: 1. No evidence for acute intracranial abnormality. 2. RIGHT frontal scalp edema. 3. Loss of cervical lordosis. 4. No evidence for acute fracture of the cervical spine. Electronically Signed   By: Norva PavlovElizabeth  Brown M.D.   On: 06/01/2019 21:37   Ct Cervical Spine Wo Contrast  Result Date: 06/01/2019 CLINICAL DATA:  Pt states he was  going up the stairs and tripped. Pt has 1.5 inch lac on right forehead, small lac on lip, chipped right front upper tooth, abrasion on knee, abrasion on left pinky and middle finger. EXAM: CT HEAD WITHOUT CONTRAST CT CERVICAL SPINE WITHOUT CONTRAST TECHNIQUE: Multidetector CT imaging of the head and cervical spine was performed following the standard protocol without intravenous contrast. Multiplanar CT image reconstructions of the cervical spine were also generated. COMPARISON:  None. FINDINGS: CT HEAD FINDINGS Brain: No evidence of acute infarction, hemorrhage, hydrocephalus, extra-axial collection or mass lesion/mass effect. Vascular: No hyperdense vessel or unexpected calcification. Skull: Normal. Negative for fracture or focal lesion. Sinuses/Orbits: No acute finding. Other: RIGHT frontal scalp edema not associated with fracture. CT CERVICAL SPINE FINDINGS Alignment: There is loss of cervical lordosis. This may be secondary to splinting, soft tissue injury, or positioning. Otherwise alignment is normal. Skull base and vertebrae: No acute fracture. No primary bone lesion or focal pathologic process. Soft tissues and spinal canal: No prevertebral fluid or swelling. No visible canal hematoma. Disc  levels:  Disc height loss noted at C5-6. Upper chest: Negative. Other: None IMPRESSION: 1. No evidence for acute intracranial abnormality. 2. RIGHT frontal scalp edema. 3. Loss of cervical lordosis. 4. No evidence for acute fracture of the cervical spine. Electronically Signed   By: Norva PavlovElizabeth  Brown M.D.   On: 06/01/2019 21:37    Procedures .Marland Kitchen.Laceration Repair  Date/Time: 06/01/2019 10:16 PM Performed by: Claude MangesSoto, Cullan Launer, PA-C Authorized by: Claude MangesSoto, Tytionna Cloyd, PA-C   Consent:    Consent obtained:  Verbal   Consent given by:  Patient   Risks discussed:  Infection, pain and poor cosmetic result Anesthesia (see MAR for exact dosages):    Anesthesia method:  Local infiltration   Local anesthetic:  Lidocaine 1% w/o epi Laceration details:    Location:  Face   Face location:  Forehead   Length (cm):  1.5   Depth (mm):  1 Repair type:    Repair type:  Simple Exploration:    Hemostasis achieved with:  Cautery, epinephrine and direct pressure Treatment:    Area cleansed with:  Saline   Amount of cleaning:  Extensive Skin repair:    Repair method:  Sutures   Suture size:  6-0   Suture material:  Prolene   Suture technique:  Simple interrupted   Number of sutures:  4 Approximation:    Approximation:  Close Post-procedure details:    Dressing:  Open (no dressing)   Patient tolerance of procedure:  Tolerated well, no immediate complications   (including critical care time)  Medications Ordered in ED Medications  lidocaine (PF) (XYLOCAINE) 1 % injection 5 mL (5 mLs Intradermal Given by Other 06/01/19 2014)  acetaminophen (TYLENOL) tablet 650 mg (650 mg Oral Given 06/01/19 2014)     Initial Impression / Assessment and Plan / ED Course  I have reviewed the triage vital signs and the nursing notes.  Pertinent labs & imaging results that were available during my care of the patient were reviewed by me and considered in my medical decision making (see chart for details).       Patient  with a past medical history of hypertension presents to the ED status post fall on the stairs, tripped on the stairs has a 1.5 inch laceration on the right forehead, small laceration on his lip.  He also has abrasions to his left hand and right knee.  Reports his last tetanus vaccine was within 5 years ago.  He also reports  striking his head on the ground, denies any headache, dizziness, vomiting.  Abrasions appears superficial, patient is ambulatory in the ED.  Vital signs are within normal limits.  He denies any blood thinner use.  CT head showed: 1. No evidence for acute intracranial abnormality.  2. RIGHT frontal scalp edema.  3. Loss of cervical lordosis.  4. No evidence for acute fracture of the cervical spine.     CT cervical spine was also obtained due to mechanism of the fall.  This showed no acute process.  Patient was given Tylenol to help with his pain.  I have personally repaired patient's laceration, he also opted for tetanus shot to be updated.  Return precautions discussed at length with patient.    Portions of this note were generated with Scientist, clinical (histocompatibility and immunogenetics)Dragon dictation software. Dictation errors may occur despite best attempts at proofreading.  Final Clinical Impressions(s) / ED Diagnoses   Final diagnoses:  Fall, initial encounter  Facial laceration, initial encounter    ED Discharge Orders    None       Claude MangesSoto, Ronte Parker, Cordelia Poche-C 06/01/19 2217    Linwood DibblesKnapp, Jon, MD 06/02/19 2324

## 2019-06-01 NOTE — Discharge Instructions (Addendum)
I have placed for stitches to your right forehead, please have these removed within 5 days.  You may apply Neosporin, bacitracin to your wound.  Please follow-up with PCP as needed.

## 2019-06-01 NOTE — ED Notes (Signed)
An After Visit Summary was printed and given to the patient. Discharge instructions given and no further questions at this time.  

## 2019-06-01 NOTE — ED Notes (Signed)
Suture cart at bedside 

## 2019-06-01 NOTE — ED Notes (Signed)
ED Provider at bedside. 

## 2019-06-01 NOTE — ED Triage Notes (Signed)
Pt states he was going up the stairs and tripped. Pt has 1.5 inch lac on right forehead, small lac on lip, chipped right front upper tooth, abrasion on knee, abrasion on left pinky and middle finger.

## 2019-06-01 NOTE — ED Notes (Signed)
Patient transported to CT 

## 2019-07-24 MED FILL — SILDENAFIL CITRATE 100 MG T: 100 | 30 days supply | Qty: 6 | Fill #1

## 2019-09-23 MED FILL — SILDENAFIL CITRATE 100 MG T: 100 | 30 days supply | Qty: 6 | Fill #2

## 2019-09-23 MED FILL — VALSARTAN 320 MG TABS: 320 | 30 days supply | Qty: 30 | Fill #1

## 2019-10-23 MED FILL — SILDENAFIL CITRATE 100 MG T: 100 | 30 days supply | Qty: 6 | Fill #3

## 2019-10-23 MED FILL — VALSARTAN 320 MG TABS: 320 | 30 days supply | Qty: 30 | Fill #2

## 2019-10-23 MED FILL — ATORVASTATIN 20 MG TABLET: 20 | 90 days supply | Qty: 90 | Fill #1

## 2019-10-29 MED FILL — AMOXICILLIN 500 MG CAPSULE: 500 | 7 days supply | Qty: 21 | Fill #0

## 2019-11-25 MED FILL — VALSARTAN 320 MG TABLET: 320 | 30 days supply | Qty: 30 | Fill #3

## 2019-11-25 MED FILL — SILDENAFIL CITRATE 100 MG T: 100 | 30 days supply | Qty: 6 | Fill #4

## 2019-12-29 MED FILL — SILDENAFIL CITRATE 100 MG T: 100 | 30 days supply | Qty: 6 | Fill #5

## 2019-12-29 MED FILL — VALSARTAN 320 MG TABLET: 320 | 30 days supply | Qty: 30 | Fill #4

## 2020-01-29 MED FILL — SILDENAFIL CITRATE 100 MG T: 100 | 30 days supply | Qty: 6 | Fill #6

## 2020-01-29 MED FILL — VALSARTAN 320 MG TABLET: 320 | 30 days supply | Qty: 30 | Fill #5

## 2020-02-22 ENCOUNTER — Other Ambulatory Visit (HOSPITAL_COMMUNITY): Payer: Self-pay | Admitting: Family Medicine

## 2020-05-25 MED FILL — VALSARTAN 320 MG TABLET: 320 | 90 days supply | Qty: 90 | Fill #1

## 2020-05-25 MED FILL — SILDENAFIL CITRATE 100 MG T: 100 | 30 days supply | Qty: 4 | Fill #1

## 2020-05-25 MED FILL — ATORVASTATIN 20 MG TABLET: 20 | 90 days supply | Qty: 90 | Fill #1

## 2020-08-29 ENCOUNTER — Other Ambulatory Visit (HOSPITAL_COMMUNITY): Payer: Self-pay | Admitting: Family Medicine

## 2020-08-29 MED FILL — ATORVASTATIN CALCIUM 20 MG: 20 | 90 days supply | Qty: 90 | Fill #0

## 2020-08-29 MED FILL — VALSARTAN 320 MG TABS: 320 | 90 days supply | Qty: 90 | Fill #0

## 2020-09-02 MED FILL — SILDENAFIL CITRATE 100 MG T: 100 | 30 days supply | Qty: 4 | Fill #2

## 2020-12-16 MED FILL — SILDENAFIL CITRATE 100 MG T: 100 | 30 days supply | Qty: 4 | Fill #3

## 2020-12-16 MED FILL — ATORVASTATIN CALCIUM 20 MG: 20 | 90 days supply | Qty: 90 | Fill #1

## 2020-12-16 MED FILL — VALSARTAN 320 MG TABLET: 320 | 90 days supply | Qty: 90 | Fill #1

## 2021-02-07 DIAGNOSIS — H524 Presbyopia: Secondary | ICD-10-CM | POA: Diagnosis not present

## 2021-03-06 ENCOUNTER — Other Ambulatory Visit (HOSPITAL_COMMUNITY): Payer: Self-pay

## 2021-03-06 DIAGNOSIS — R809 Proteinuria, unspecified: Secondary | ICD-10-CM | POA: Diagnosis not present

## 2021-03-06 DIAGNOSIS — Z0000 Encounter for general adult medical examination without abnormal findings: Secondary | ICD-10-CM | POA: Diagnosis not present

## 2021-03-06 DIAGNOSIS — I10 Essential (primary) hypertension: Secondary | ICD-10-CM | POA: Diagnosis not present

## 2021-03-06 DIAGNOSIS — E7800 Pure hypercholesterolemia, unspecified: Secondary | ICD-10-CM | POA: Diagnosis not present

## 2021-03-06 DIAGNOSIS — M2540 Effusion, unspecified joint: Secondary | ICD-10-CM | POA: Diagnosis not present

## 2021-03-06 DIAGNOSIS — E1169 Type 2 diabetes mellitus with other specified complication: Secondary | ICD-10-CM | POA: Diagnosis not present

## 2021-03-06 MED ORDER — ATORVASTATIN CALCIUM 40 MG PO TABS
ORAL_TABLET | ORAL | 2 refills | Status: AC
  Filled 2021-03-06 – 2021-03-14 (×2): qty 90, 90d supply, fill #0

## 2021-03-06 MED ORDER — VALSARTAN 320 MG PO TABS
ORAL_TABLET | ORAL | 2 refills | Status: AC
  Filled 2021-03-06: qty 90, 90d supply, fill #0

## 2021-03-06 MED ORDER — TADALAFIL 20 MG PO TABS
ORAL_TABLET | ORAL | 12 refills | Status: AC
  Filled 2021-03-06: qty 10, 10d supply, fill #0
  Filled 2021-03-14: qty 10, 30d supply, fill #0
  Filled 2022-01-25: qty 10, 30d supply, fill #1
  Filled 2022-03-05: qty 10, 30d supply, fill #2

## 2021-03-14 ENCOUNTER — Other Ambulatory Visit (HOSPITAL_COMMUNITY): Payer: Self-pay

## 2021-03-14 MED FILL — Valsartan Tab 320 MG: ORAL | 90 days supply | Qty: 90 | Fill #0 | Status: AC

## 2021-05-03 DIAGNOSIS — H6121 Impacted cerumen, right ear: Secondary | ICD-10-CM | POA: Diagnosis not present

## 2021-05-03 DIAGNOSIS — H903 Sensorineural hearing loss, bilateral: Secondary | ICD-10-CM | POA: Diagnosis not present

## 2021-05-03 DIAGNOSIS — H9121 Sudden idiopathic hearing loss, right ear: Secondary | ICD-10-CM | POA: Diagnosis not present

## 2021-05-04 ENCOUNTER — Other Ambulatory Visit (HOSPITAL_COMMUNITY): Payer: Self-pay

## 2021-05-04 MED ORDER — PREDNISONE 10 MG (21) PO TBPK
ORAL_TABLET | ORAL | 0 refills | Status: AC
  Filled 2021-05-04: qty 21, 6d supply, fill #0

## 2021-05-10 DIAGNOSIS — H903 Sensorineural hearing loss, bilateral: Secondary | ICD-10-CM | POA: Diagnosis not present

## 2021-05-10 DIAGNOSIS — H9121 Sudden idiopathic hearing loss, right ear: Secondary | ICD-10-CM | POA: Diagnosis not present

## 2021-06-23 ENCOUNTER — Other Ambulatory Visit (HOSPITAL_COMMUNITY): Payer: Self-pay

## 2021-07-04 ENCOUNTER — Other Ambulatory Visit (HOSPITAL_COMMUNITY): Payer: Self-pay

## 2021-07-05 ENCOUNTER — Other Ambulatory Visit (HOSPITAL_COMMUNITY): Payer: Self-pay

## 2021-07-10 ENCOUNTER — Other Ambulatory Visit (HOSPITAL_BASED_OUTPATIENT_CLINIC_OR_DEPARTMENT_OTHER): Payer: Self-pay

## 2021-07-12 ENCOUNTER — Other Ambulatory Visit (HOSPITAL_COMMUNITY): Payer: Self-pay

## 2021-07-24 ENCOUNTER — Other Ambulatory Visit (HOSPITAL_COMMUNITY): Payer: Self-pay

## 2021-07-31 ENCOUNTER — Other Ambulatory Visit (HOSPITAL_COMMUNITY): Payer: Self-pay

## 2021-08-01 ENCOUNTER — Other Ambulatory Visit (HOSPITAL_COMMUNITY): Payer: Self-pay

## 2021-08-02 ENCOUNTER — Other Ambulatory Visit (HOSPITAL_COMMUNITY): Payer: Self-pay

## 2021-08-03 ENCOUNTER — Other Ambulatory Visit (HOSPITAL_COMMUNITY): Payer: Self-pay

## 2021-08-04 ENCOUNTER — Other Ambulatory Visit (HOSPITAL_COMMUNITY): Payer: Self-pay

## 2021-08-14 ENCOUNTER — Other Ambulatory Visit (HOSPITAL_COMMUNITY): Payer: Self-pay

## 2021-08-14 DIAGNOSIS — K219 Gastro-esophageal reflux disease without esophagitis: Secondary | ICD-10-CM | POA: Diagnosis not present

## 2021-08-14 DIAGNOSIS — R490 Dysphonia: Secondary | ICD-10-CM | POA: Diagnosis not present

## 2021-08-14 MED ORDER — FAMOTIDINE 20 MG PO TABS
ORAL_TABLET | ORAL | 4 refills | Status: AC
  Filled 2021-08-14: qty 60, 60d supply, fill #0

## 2021-09-06 ENCOUNTER — Other Ambulatory Visit (HOSPITAL_COMMUNITY): Payer: Self-pay

## 2021-09-06 DIAGNOSIS — E1169 Type 2 diabetes mellitus with other specified complication: Secondary | ICD-10-CM | POA: Diagnosis not present

## 2021-09-06 DIAGNOSIS — E7800 Pure hypercholesterolemia, unspecified: Secondary | ICD-10-CM | POA: Diagnosis not present

## 2021-09-06 DIAGNOSIS — R809 Proteinuria, unspecified: Secondary | ICD-10-CM | POA: Diagnosis not present

## 2021-09-06 DIAGNOSIS — K219 Gastro-esophageal reflux disease without esophagitis: Secondary | ICD-10-CM | POA: Diagnosis not present

## 2021-09-06 DIAGNOSIS — I10 Essential (primary) hypertension: Secondary | ICD-10-CM | POA: Diagnosis not present

## 2021-09-06 MED ORDER — ATORVASTATIN CALCIUM 40 MG PO TABS
ORAL_TABLET | ORAL | 2 refills | Status: AC
  Filled 2021-09-06 – 2021-11-27 (×2): qty 90, 90d supply, fill #0
  Filled 2022-03-05: qty 90, 90d supply, fill #1
  Filled 2022-09-05: qty 90, 90d supply, fill #2

## 2021-09-06 MED ORDER — VALSARTAN 320 MG PO TABS
ORAL_TABLET | ORAL | 2 refills | Status: AC
  Filled 2021-09-06 – 2021-11-27 (×2): qty 90, 90d supply, fill #0
  Filled 2022-03-05: qty 90, 90d supply, fill #1
  Filled 2022-09-05: qty 90, 90d supply, fill #2

## 2021-09-15 ENCOUNTER — Other Ambulatory Visit (HOSPITAL_COMMUNITY): Payer: Self-pay

## 2021-09-28 DIAGNOSIS — K219 Gastro-esophageal reflux disease without esophagitis: Secondary | ICD-10-CM | POA: Diagnosis not present

## 2021-09-28 DIAGNOSIS — R490 Dysphonia: Secondary | ICD-10-CM | POA: Diagnosis not present

## 2021-11-27 ENCOUNTER — Other Ambulatory Visit (HOSPITAL_COMMUNITY): Payer: Self-pay

## 2021-11-28 ENCOUNTER — Other Ambulatory Visit (HOSPITAL_COMMUNITY): Payer: Self-pay

## 2021-12-05 ENCOUNTER — Other Ambulatory Visit (HOSPITAL_COMMUNITY): Payer: Self-pay

## 2021-12-06 ENCOUNTER — Other Ambulatory Visit (HOSPITAL_COMMUNITY): Payer: Self-pay

## 2021-12-08 ENCOUNTER — Other Ambulatory Visit (HOSPITAL_COMMUNITY): Payer: Self-pay

## 2021-12-08 DIAGNOSIS — H2513 Age-related nuclear cataract, bilateral: Secondary | ICD-10-CM | POA: Diagnosis not present

## 2021-12-08 DIAGNOSIS — H43393 Other vitreous opacities, bilateral: Secondary | ICD-10-CM | POA: Diagnosis not present

## 2021-12-12 ENCOUNTER — Other Ambulatory Visit (HOSPITAL_COMMUNITY): Payer: Self-pay

## 2021-12-14 ENCOUNTER — Other Ambulatory Visit (HOSPITAL_COMMUNITY): Payer: Self-pay

## 2022-01-05 ENCOUNTER — Other Ambulatory Visit (HOSPITAL_COMMUNITY): Payer: Self-pay

## 2022-01-25 ENCOUNTER — Other Ambulatory Visit (HOSPITAL_COMMUNITY): Payer: Self-pay

## 2022-03-05 ENCOUNTER — Other Ambulatory Visit (HOSPITAL_COMMUNITY): Payer: Self-pay

## 2022-03-14 ENCOUNTER — Other Ambulatory Visit (HOSPITAL_COMMUNITY): Payer: Self-pay

## 2022-03-14 DIAGNOSIS — E1169 Type 2 diabetes mellitus with other specified complication: Secondary | ICD-10-CM | POA: Diagnosis not present

## 2022-03-14 DIAGNOSIS — Z1159 Encounter for screening for other viral diseases: Secondary | ICD-10-CM | POA: Diagnosis not present

## 2022-03-14 DIAGNOSIS — I10 Essential (primary) hypertension: Secondary | ICD-10-CM | POA: Diagnosis not present

## 2022-03-14 DIAGNOSIS — K219 Gastro-esophageal reflux disease without esophagitis: Secondary | ICD-10-CM | POA: Diagnosis not present

## 2022-03-14 DIAGNOSIS — Z23 Encounter for immunization: Secondary | ICD-10-CM | POA: Diagnosis not present

## 2022-03-14 DIAGNOSIS — Z0000 Encounter for general adult medical examination without abnormal findings: Secondary | ICD-10-CM | POA: Diagnosis not present

## 2022-03-14 DIAGNOSIS — E7800 Pure hypercholesterolemia, unspecified: Secondary | ICD-10-CM | POA: Diagnosis not present

## 2022-03-14 DIAGNOSIS — R809 Proteinuria, unspecified: Secondary | ICD-10-CM | POA: Diagnosis not present

## 2022-03-14 MED ORDER — VALSARTAN 320 MG PO TABS
ORAL_TABLET | ORAL | 2 refills | Status: AC
  Filled 2022-06-01: qty 90, 90d supply, fill #0
  Filled 2023-03-05: qty 90, 90d supply, fill #1

## 2022-03-14 MED ORDER — ATORVASTATIN CALCIUM 40 MG PO TABS
ORAL_TABLET | ORAL | 2 refills | Status: AC
  Filled 2022-03-14 – 2022-06-01 (×2): qty 90, 90d supply, fill #0
  Filled 2023-03-05: qty 90, 90d supply, fill #1

## 2022-03-14 MED ORDER — SILDENAFIL CITRATE 100 MG PO TABS
ORAL_TABLET | ORAL | 12 refills | Status: DC
  Filled 2022-03-14: qty 10, 30d supply, fill #0
  Filled 2022-06-01: qty 10, 30d supply, fill #1
  Filled 2022-09-05: qty 10, 30d supply, fill #2
  Filled 2022-12-07: qty 10, 30d supply, fill #3
  Filled 2023-03-05: qty 10, 30d supply, fill #4

## 2022-06-01 ENCOUNTER — Other Ambulatory Visit (HOSPITAL_COMMUNITY): Payer: Self-pay

## 2022-09-05 ENCOUNTER — Other Ambulatory Visit (HOSPITAL_COMMUNITY): Payer: Self-pay

## 2022-09-13 ENCOUNTER — Other Ambulatory Visit (HOSPITAL_COMMUNITY): Payer: Self-pay

## 2022-09-13 DIAGNOSIS — M79601 Pain in right arm: Secondary | ICD-10-CM | POA: Diagnosis not present

## 2022-09-13 DIAGNOSIS — E7800 Pure hypercholesterolemia, unspecified: Secondary | ICD-10-CM | POA: Diagnosis not present

## 2022-09-13 DIAGNOSIS — K219 Gastro-esophageal reflux disease without esophagitis: Secondary | ICD-10-CM | POA: Diagnosis not present

## 2022-09-13 DIAGNOSIS — Z23 Encounter for immunization: Secondary | ICD-10-CM | POA: Diagnosis not present

## 2022-09-13 DIAGNOSIS — E1169 Type 2 diabetes mellitus with other specified complication: Secondary | ICD-10-CM | POA: Diagnosis not present

## 2022-09-13 DIAGNOSIS — R809 Proteinuria, unspecified: Secondary | ICD-10-CM | POA: Diagnosis not present

## 2022-09-13 DIAGNOSIS — I10 Essential (primary) hypertension: Secondary | ICD-10-CM | POA: Diagnosis not present

## 2022-09-13 MED ORDER — ATORVASTATIN CALCIUM 40 MG PO TABS
40.0000 mg | ORAL_TABLET | Freq: Every day | ORAL | 2 refills | Status: AC
  Filled 2022-09-13 – 2022-12-07 (×2): qty 90, 90d supply, fill #0
  Filled 2023-06-07: qty 90, 90d supply, fill #1

## 2022-09-13 MED ORDER — VALSARTAN 320 MG PO TABS
320.0000 mg | ORAL_TABLET | Freq: Every day | ORAL | 2 refills | Status: AC
  Filled 2022-09-13 – 2022-12-07 (×2): qty 90, 90d supply, fill #0
  Filled 2023-06-07: qty 90, 90d supply, fill #1

## 2022-12-07 ENCOUNTER — Other Ambulatory Visit (HOSPITAL_COMMUNITY): Payer: Self-pay

## 2023-02-06 DIAGNOSIS — H43393 Other vitreous opacities, bilateral: Secondary | ICD-10-CM | POA: Diagnosis not present

## 2023-02-06 DIAGNOSIS — H2513 Age-related nuclear cataract, bilateral: Secondary | ICD-10-CM | POA: Diagnosis not present

## 2023-03-05 ENCOUNTER — Other Ambulatory Visit (HOSPITAL_COMMUNITY): Payer: Self-pay

## 2023-03-28 ENCOUNTER — Other Ambulatory Visit (HOSPITAL_COMMUNITY): Payer: Self-pay

## 2023-03-28 DIAGNOSIS — Z0000 Encounter for general adult medical examination without abnormal findings: Secondary | ICD-10-CM | POA: Diagnosis not present

## 2023-03-28 DIAGNOSIS — E1169 Type 2 diabetes mellitus with other specified complication: Secondary | ICD-10-CM | POA: Diagnosis not present

## 2023-03-28 DIAGNOSIS — R809 Proteinuria, unspecified: Secondary | ICD-10-CM | POA: Diagnosis not present

## 2023-03-28 DIAGNOSIS — E7800 Pure hypercholesterolemia, unspecified: Secondary | ICD-10-CM | POA: Diagnosis not present

## 2023-03-28 DIAGNOSIS — M109 Gout, unspecified: Secondary | ICD-10-CM | POA: Diagnosis not present

## 2023-03-28 DIAGNOSIS — I10 Essential (primary) hypertension: Secondary | ICD-10-CM | POA: Diagnosis not present

## 2023-03-28 DIAGNOSIS — E119 Type 2 diabetes mellitus without complications: Secondary | ICD-10-CM | POA: Diagnosis not present

## 2023-03-28 DIAGNOSIS — K219 Gastro-esophageal reflux disease without esophagitis: Secondary | ICD-10-CM | POA: Diagnosis not present

## 2023-03-28 MED ORDER — ATORVASTATIN CALCIUM 40 MG PO TABS: 40.0000 mg | ORAL_TABLET | Freq: Every day | ORAL | 2 refills | Status: AC

## 2023-03-28 MED ORDER — VALSARTAN 320 MG PO TABS: 320.0000 mg | ORAL_TABLET | Freq: Every day | ORAL | 2 refills | Status: AC

## 2023-04-13 ENCOUNTER — Other Ambulatory Visit (HOSPITAL_COMMUNITY): Payer: Self-pay

## 2023-06-07 ENCOUNTER — Other Ambulatory Visit (HOSPITAL_COMMUNITY): Payer: Self-pay

## 2023-06-10 ENCOUNTER — Other Ambulatory Visit (HOSPITAL_COMMUNITY): Payer: Self-pay

## 2023-06-10 MED ORDER — SILDENAFIL CITRATE 100 MG PO TABS
50.0000 mg | ORAL_TABLET | ORAL | 1 refills | Status: AC
  Filled 2023-06-10: qty 4, 30d supply, fill #0
  Filled 2024-04-30: qty 4, 30d supply, fill #1

## 2023-09-17 ENCOUNTER — Other Ambulatory Visit (HOSPITAL_COMMUNITY): Payer: Self-pay

## 2023-09-17 DIAGNOSIS — E118 Type 2 diabetes mellitus with unspecified complications: Secondary | ICD-10-CM | POA: Diagnosis not present

## 2023-09-17 DIAGNOSIS — E782 Mixed hyperlipidemia: Secondary | ICD-10-CM | POA: Diagnosis not present

## 2023-09-17 DIAGNOSIS — I10 Essential (primary) hypertension: Secondary | ICD-10-CM | POA: Diagnosis not present

## 2023-09-17 MED ORDER — VALSARTAN 320 MG PO TABS
320.0000 mg | ORAL_TABLET | Freq: Every day | ORAL | 2 refills | Status: AC
  Filled 2023-09-17: qty 90, 90d supply, fill #0
  Filled 2024-01-30: qty 90, 90d supply, fill #1

## 2023-09-17 MED ORDER — ATORVASTATIN CALCIUM 40 MG PO TABS
40.0000 mg | ORAL_TABLET | Freq: Every day | ORAL | 2 refills | Status: DC
  Filled 2023-09-17: qty 90, 90d supply, fill #0
  Filled 2024-01-30: qty 90, 90d supply, fill #1
  Filled 2024-07-29: qty 90, 90d supply, fill #2

## 2023-09-17 MED ORDER — TADALAFIL 10 MG PO TABS
10.0000 mg | ORAL_TABLET | Freq: Every day | ORAL | 11 refills | Status: AC | PRN
  Filled 2023-09-17: qty 30, 30d supply, fill #0
  Filled 2024-01-30: qty 30, 30d supply, fill #1
  Filled 2024-07-29: qty 30, 30d supply, fill #2

## 2024-01-30 ENCOUNTER — Other Ambulatory Visit (HOSPITAL_COMMUNITY): Payer: Self-pay

## 2024-03-14 DIAGNOSIS — I10 Essential (primary) hypertension: Secondary | ICD-10-CM | POA: Diagnosis not present

## 2024-03-14 DIAGNOSIS — E7800 Pure hypercholesterolemia, unspecified: Secondary | ICD-10-CM | POA: Diagnosis not present

## 2024-03-14 DIAGNOSIS — E782 Mixed hyperlipidemia: Secondary | ICD-10-CM | POA: Diagnosis not present

## 2024-03-14 DIAGNOSIS — E118 Type 2 diabetes mellitus with unspecified complications: Secondary | ICD-10-CM | POA: Diagnosis not present

## 2024-03-17 ENCOUNTER — Other Ambulatory Visit (HOSPITAL_BASED_OUTPATIENT_CLINIC_OR_DEPARTMENT_OTHER): Payer: Self-pay

## 2024-03-17 ENCOUNTER — Other Ambulatory Visit (HOSPITAL_COMMUNITY): Payer: Self-pay

## 2024-03-17 DIAGNOSIS — E118 Type 2 diabetes mellitus with unspecified complications: Secondary | ICD-10-CM | POA: Diagnosis not present

## 2024-03-17 DIAGNOSIS — I10 Essential (primary) hypertension: Secondary | ICD-10-CM | POA: Diagnosis not present

## 2024-03-17 DIAGNOSIS — Z0000 Encounter for general adult medical examination without abnormal findings: Secondary | ICD-10-CM | POA: Diagnosis not present

## 2024-03-17 DIAGNOSIS — E782 Mixed hyperlipidemia: Secondary | ICD-10-CM | POA: Diagnosis not present

## 2024-03-17 MED ORDER — ATORVASTATIN CALCIUM 40 MG PO TABS
40.0000 mg | ORAL_TABLET | Freq: Every day | ORAL | 2 refills | Status: AC
  Filled 2024-03-17 – 2024-04-30 (×2): qty 90, 90d supply, fill #0

## 2024-03-17 MED ORDER — VALSARTAN 320 MG PO TABS
320.0000 mg | ORAL_TABLET | Freq: Every day | ORAL | 2 refills | Status: AC
  Filled 2024-03-17: qty 90, 90d supply, fill #0
  Filled 2024-04-30: qty 5, 5d supply, fill #0
  Filled 2024-04-30: qty 85, 85d supply, fill #0
  Filled 2024-07-29: qty 90, 90d supply, fill #1
  Filled 2024-10-28: qty 90, 90d supply, fill #2

## 2024-03-21 DIAGNOSIS — H2513 Age-related nuclear cataract, bilateral: Secondary | ICD-10-CM | POA: Diagnosis not present

## 2024-03-21 DIAGNOSIS — H43393 Other vitreous opacities, bilateral: Secondary | ICD-10-CM | POA: Diagnosis not present

## 2024-04-30 ENCOUNTER — Other Ambulatory Visit (HOSPITAL_COMMUNITY): Payer: Self-pay

## 2024-05-14 DIAGNOSIS — E782 Mixed hyperlipidemia: Secondary | ICD-10-CM | POA: Diagnosis not present

## 2024-05-14 DIAGNOSIS — E118 Type 2 diabetes mellitus with unspecified complications: Secondary | ICD-10-CM | POA: Diagnosis not present

## 2024-05-14 DIAGNOSIS — E7800 Pure hypercholesterolemia, unspecified: Secondary | ICD-10-CM | POA: Diagnosis not present

## 2024-05-14 DIAGNOSIS — I10 Essential (primary) hypertension: Secondary | ICD-10-CM | POA: Diagnosis not present

## 2024-06-14 DIAGNOSIS — E118 Type 2 diabetes mellitus with unspecified complications: Secondary | ICD-10-CM | POA: Diagnosis not present

## 2024-06-14 DIAGNOSIS — E782 Mixed hyperlipidemia: Secondary | ICD-10-CM | POA: Diagnosis not present

## 2024-06-14 DIAGNOSIS — I10 Essential (primary) hypertension: Secondary | ICD-10-CM | POA: Diagnosis not present

## 2024-06-14 DIAGNOSIS — E7800 Pure hypercholesterolemia, unspecified: Secondary | ICD-10-CM | POA: Diagnosis not present

## 2024-07-14 DIAGNOSIS — I10 Essential (primary) hypertension: Secondary | ICD-10-CM | POA: Diagnosis not present

## 2024-07-14 DIAGNOSIS — E782 Mixed hyperlipidemia: Secondary | ICD-10-CM | POA: Diagnosis not present

## 2024-07-14 DIAGNOSIS — E7800 Pure hypercholesterolemia, unspecified: Secondary | ICD-10-CM | POA: Diagnosis not present

## 2024-07-14 DIAGNOSIS — E118 Type 2 diabetes mellitus with unspecified complications: Secondary | ICD-10-CM | POA: Diagnosis not present

## 2024-07-29 ENCOUNTER — Other Ambulatory Visit (HOSPITAL_COMMUNITY): Payer: Self-pay

## 2024-07-29 ENCOUNTER — Other Ambulatory Visit (HOSPITAL_BASED_OUTPATIENT_CLINIC_OR_DEPARTMENT_OTHER): Payer: Self-pay

## 2024-08-19 NOTE — Progress Notes (Signed)
 James Horn                                          MRN: [id number]   08/19/2024   The [facility] Quality Team Specialist reviewed this patient medical record for the purposes of chart review for care gap closure. The following were reviewed: chart review for care gap closure-kidney health evaluation for diabetes:uACR.    [facility] Quality Team

## 2024-09-18 ENCOUNTER — Other Ambulatory Visit (HOSPITAL_COMMUNITY): Payer: Self-pay

## 2024-09-18 MED ORDER — VALSARTAN 320 MG PO TABS
320.0000 mg | ORAL_TABLET | Freq: Every day | ORAL | 1 refills | Status: AC
  Filled 2024-09-18: qty 100, 100d supply, fill #0

## 2024-09-18 MED ORDER — ATORVASTATIN CALCIUM 40 MG PO TABS
40.0000 mg | ORAL_TABLET | Freq: Every day | ORAL | 1 refills | Status: AC
  Filled 2024-09-18: qty 100, 100d supply, fill #0

## 2024-10-28 ENCOUNTER — Other Ambulatory Visit (HOSPITAL_COMMUNITY): Payer: Self-pay

## 2024-10-29 ENCOUNTER — Other Ambulatory Visit (HOSPITAL_COMMUNITY): Payer: Self-pay

## 2024-10-29 MED ORDER — TADALAFIL 10 MG PO TABS
10.0000 mg | ORAL_TABLET | Freq: Every day | ORAL | 11 refills | Status: AC
  Filled 2024-10-29: qty 30, 30d supply, fill #0

## 2024-10-29 MED ORDER — ATORVASTATIN CALCIUM 40 MG PO TABS
40.0000 mg | ORAL_TABLET | Freq: Every day | ORAL | 1 refills | Status: AC
  Filled 2024-10-29: qty 100, 100d supply, fill #0

## 2024-10-30 ENCOUNTER — Other Ambulatory Visit (HOSPITAL_COMMUNITY): Payer: Self-pay
